# Patient Record
Sex: Male | Born: 1950 | Race: White | Hispanic: No | Marital: Married | Smoking: Former smoker
Health system: Southern US, Community
[De-identification: ages and names within clinical notes are randomized; demographics above are authoritative.]

## PROBLEM LIST (undated history)

## (undated) DIAGNOSIS — F32A Depression, unspecified: Secondary | ICD-10-CM

## (undated) DIAGNOSIS — F329 Major depressive disorder, single episode, unspecified: Secondary | ICD-10-CM

## (undated) DIAGNOSIS — F419 Anxiety disorder, unspecified: Secondary | ICD-10-CM

## (undated) DIAGNOSIS — J45909 Unspecified asthma, uncomplicated: Secondary | ICD-10-CM

## (undated) DIAGNOSIS — T7840XA Allergy, unspecified, initial encounter: Secondary | ICD-10-CM

## (undated) HISTORY — DX: Anxiety disorder, unspecified: F41.9

## (undated) HISTORY — PX: APPENDECTOMY: SHX54

## (undated) HISTORY — DX: Depression, unspecified: F32.A

## (undated) HISTORY — DX: Major depressive disorder, single episode, unspecified: F32.9

## (undated) HISTORY — DX: Allergy, unspecified, initial encounter: T78.40XA

---

## 2013-11-29 ENCOUNTER — Emergency Department (HOSPITAL_COMMUNITY)
Admission: EM | Admit: 2013-11-29 | Discharge: 2013-11-29 | Disposition: A | Discharge status: Home / Self Care | Payer: Self-pay | Attending: Emergency Medicine | Admitting: Emergency Medicine

## 2013-11-29 ENCOUNTER — Emergency Department (HOSPITAL_COMMUNITY): Payer: Self-pay

## 2013-11-29 ENCOUNTER — Encounter (HOSPITAL_COMMUNITY): Payer: Self-pay | Admitting: Emergency Medicine

## 2013-11-29 DIAGNOSIS — Z79899 Other long term (current) drug therapy: Secondary | ICD-10-CM | POA: Insufficient documentation

## 2013-11-29 DIAGNOSIS — J441 Chronic obstructive pulmonary disease with (acute) exacerbation: Secondary | ICD-10-CM | POA: Insufficient documentation

## 2013-11-29 DIAGNOSIS — Z87891 Personal history of nicotine dependence: Secondary | ICD-10-CM | POA: Insufficient documentation

## 2013-11-29 HISTORY — DX: Unspecified asthma, uncomplicated: J45.909

## 2013-11-29 LAB — BASIC METABOLIC PANEL
BUN: 9 mg/dL (ref 6–23)
CO2: 24 mEq/L (ref 19–32)
Chloride: 100 mEq/L (ref 96–112)
GFR calc Af Amer: >90 mL/min (ref >90)
Glucose, Bld: 131 mg/dL — ABNORMAL HIGH (ref 70–99)
Potassium: 3.8 mEq/L (ref 3.5–5.1)
Sodium: 136 mEq/L (ref 135–145)

## 2013-11-29 LAB — CBC WITH DIFFERENTIAL/PLATELET
Basophils Relative: 1 % (ref 0–1)
HCT: 44.1 % (ref 39.0–52.0)
Hemoglobin: 15.5 g/dL (ref 13.0–17.0)
Lymphocytes Relative: 21 % (ref 12–46)
Lymphs Abs: 2.2 10*3/uL (ref 0.7–4.0)
Monocytes Relative: 4 % (ref 3–12)
Neutro Abs: 7.6 10*3/uL (ref 1.7–7.7)
Neutrophils Relative %: 71 % (ref 43–77)
RBC: 4.9 MIL/uL (ref 4.22–5.81)
WBC: 10.7 10*3/uL — ABNORMAL HIGH (ref 4.0–10.5)

## 2013-11-29 LAB — PRO B NATRIURETIC PEPTIDE: Pro B Natriuretic peptide (BNP): 56.3 pg/mL (ref 0–125)

## 2013-11-29 MED ORDER — ALBUTEROL SULFATE HFA 108 (90 BASE) MCG/ACT IN AERS
1.0000 | INHALATION_SPRAY | RESPIRATORY_TRACT | Status: DC | PRN
  Filled 2013-11-29: qty 6.7

## 2013-11-29 MED ORDER — ALBUTEROL SULFATE HFA 108 (90 BASE) MCG/ACT IN AERS: 1.0000 | INHALATION_SPRAY | RESPIRATORY_TRACT | Status: DC | PRN

## 2013-11-29 MED ORDER — ALBUTEROL SULFATE (5 MG/ML) 0.5% IN NEBU
5.0000 mg | INHALATION_SOLUTION | RESPIRATORY_TRACT | Status: DC | PRN
Start: 2013-11-29 — End: 2013-11-29
  Administered 2013-11-29: 5 mg via RESPIRATORY_TRACT
  Filled 2013-11-29: qty 1

## 2013-11-29 MED ORDER — PREDNISONE (PAK) 10 MG PO TABS: ORAL_TABLET | ORAL | Status: DC

## 2013-11-29 NOTE — ED Notes (Signed)
Bed: ZO10 Expected date: 11/29/13 Expected time: 11:45 AM Means of arrival: Ambulance Comments: Wheezing/tx in process

## 2013-11-29 NOTE — ED Notes (Signed)
Pt has childhood history of asthma and rarely has attacks. However, pt states that he has been having SOB with wheezing that has gotten progressively worse over the last couple days but got worse last night. Pt is alert and oriented and has received 2 neb treatments, solumedrol,and mag IV by EMS.

## 2013-11-29 NOTE — ED Provider Notes (Signed)
CSN: [id number]     Arrival date & time 11/29/13  1159 History   First MD Initiated Contact with Patient 11/29/13 1214     Chief Complaint  Patient presents with  . Wheezing    HPI Comments: Pt has had intermittent wheezing in his life with the last episode about ten years ago.  In the last two months he started having trouble with short of breath.  He has not seen anyone but last night it became worse.  EMS treated him with albuterol , steroids and magnesium.    He is feeling better now after that.  Patient is a 62 y.o. male presenting with wheezing. The history is provided by the patient.  Wheezing Severity:  Severe Severity compared to prior episodes:  More severe Onset quality:  Gradual Duration: months. Timing:  Constant Progression:  Worsening Context: not animal exposure   Worsened by:  Nothing tried Ineffective treatments: he has tried bronchaid. Associated symptoms: cough and shortness of breath   Associated symptoms: no chest pain, no fever and no sore throat   He feels that it is worse mostly at night time.  Preceding all of this he remembers choking on some honey that he was eating.  Past Medical History  Diagnosis Date  . Asthma    No past surgical history on file. No family history on file. History  Substance Use Topics  . Smoking status: Former Smoker -- 0.50 packs/day    Types: Cigarettes    Quit date: 11/16/2013  . Smokeless tobacco: Never Used  . Alcohol Use: No    Review of Systems  Constitutional: Negative for fever.  HENT: Negative for sore throat.   Respiratory: Positive for cough, shortness of breath and wheezing.   Cardiovascular: Negative for chest pain.  All other systems reviewed and are negative.    Allergies  Review of patient's allergies indicates no known allergies.  Home Medications   Current Outpatient Rx  Name  Route  Sig  Dispense  Refill  . calcium carbonate (OS-CAL) 600 MG TABS tablet   Oral   Take 600 mg by mouth 2 (two)  times daily with a meal.         . cholecalciferol (VITAMIN D) 1000 UNITS tablet   Oral   Take 1,000 Units by mouth daily.         Marland Kitchen Ephedrine-Guaifenesin (BRONKAID) 25-400 MG TABS   Oral   Take 1 tablet by mouth every 4 (four) hours as needed (for chest congestion).         . ferrous sulfate 325 (65 FE) MG tablet   Oral   Take 325 mg by mouth daily with breakfast.         . Multiple Vitamin (MULTIVITAMIN WITH MINERALS) TABS tablet   Oral   Take 1 tablet by mouth daily.         Marland Kitchen omega-3 acid ethyl esters (LOVAZA) 1 G capsule   Oral   Take 1 g by mouth daily.         . vitamin B-12 (CYANOCOBALAMIN) 1000 MCG tablet   Oral   Take 1,000 mcg by mouth daily.         . vitamin C (ASCORBIC ACID) 500 MG tablet   Oral   Take 500 mg by mouth daily.         Marland Kitchen zinc sulfate 220 MG capsule   Oral   Take 220 mg by mouth daily.         Marland Kitchen  albuterol (PROVENTIL HFA;VENTOLIN HFA) 108 (90 BASE) MCG/ACT inhaler   Inhalation   Inhale 1-2 puffs into the lungs every 4 (four) hours as needed for wheezing or shortness of breath.   1 Inhaler   0   . predniSONE (STERAPRED UNI-PAK) 10 MG tablet      Take 6 tabs by mouth daily  for 2 days, then 5 tabs for 2 days, then 4 tabs for 2 days, then 3 tabs for 2 days, 2 tabs for 2 days, then 1 tab by mouth daily for 2 days   42 tablet   0    BP 151/82  Pulse 96  Temp(Src) 98.1 F (36.7 C) (Oral)  Resp 22  Ht 5\' 9"  (1.753 m)  Wt 145 lb (65.772 kg)  BMI 21.40 kg/m2  SpO2 94% Physical Exam  Nursing note and vitals reviewed. Constitutional: He appears well-developed and well-nourished. No distress.  HENT:  Head: Normocephalic and atraumatic.  Right Ear: External ear normal.  Left Ear: External ear normal.  Eyes: Conjunctivae are normal. Right eye exhibits no discharge. Left eye exhibits no discharge. No scleral icterus.  Neck: Neck supple. No tracheal deviation present.  Cardiovascular: Normal rate, regular rhythm and intact  distal pulses.   Pulmonary/Chest: Effort normal. No accessory muscle usage or stridor. No respiratory distress. He has no decreased breath sounds. He has wheezes. He has no rales.  Abdominal: Soft. Bowel sounds are normal. He exhibits no distension. There is no tenderness. There is no rebound and no guarding.  Musculoskeletal: He exhibits no edema and no tenderness.  Neurological: He is alert. He has normal strength. No sensory deficit. Cranial nerve deficit:  no gross defecits noted. He exhibits normal muscle tone. He displays no seizure activity. Coordination normal.  Skin: Skin is warm and dry. No rash noted.  Psychiatric: He has a normal mood and affect.    ED Course  Procedures (including critical care time) Labs Review Labs Reviewed  CBC WITH DIFFERENTIAL - Abnormal; Notable for the following:    WBC 10.7 (*)    All other components within normal limits  BASIC METABOLIC PANEL - Abnormal; Notable for the following:    Glucose, Bld 131 (*)    All other components within normal limits  PRO B NATRIURETIC PEPTIDE   Imaging Review Dg Chest 2 View  11/29/2013   CLINICAL DATA:  Cough and wheezing and dyspnea, history of tobacco use as well styled and asthma.  EXAM: CHEST  2 VIEW  COMPARISON:  None.  FINDINGS: The lungs are hyperinflated with hemidiaphragm flattening. There is no focal infiltrate. There is no pleural effusion or pneumothorax. The cardiopericardial silhouette is normal in size. The mediastinum is normal in width. The pulmonary vascularity is not engorged. The observed portions of the bony thorax exhibit no acute abnormality.  IMPRESSION: There is hyperinflation consistent with COPD. There is no evidence of pneumonia nor CHF.   Electronically Signed   By: David  [place]   On: 11/29/2013 13:26    EKG Interpretation   None       MDM   1. COPD exacerbation    Pt improved with treatment. Counseled on smoking cessation.   Will dc home with inhaler and steroids.  Follow up  with a PCP.  Referrals given    Celene Kras, MD 11/29/13 [phone]

## 2014-02-14 ENCOUNTER — Ambulatory Visit: Payer: Managed Care, Other (non HMO) | Admitting: Family Medicine

## 2014-02-14 VITALS — BP 138/82 | HR 83 | Temp 97.8°F | Resp 16 | Ht 68.35 in | Wt 153.6 lb

## 2014-02-14 DIAGNOSIS — J4489 Other specified chronic obstructive pulmonary disease: Secondary | ICD-10-CM

## 2014-02-14 DIAGNOSIS — J449 Chronic obstructive pulmonary disease, unspecified: Secondary | ICD-10-CM

## 2014-02-14 DIAGNOSIS — J4540 Moderate persistent asthma, uncomplicated: Secondary | ICD-10-CM

## 2014-02-14 DIAGNOSIS — J45909 Unspecified asthma, uncomplicated: Secondary | ICD-10-CM

## 2014-02-14 MED ORDER — ALBUTEROL SULFATE HFA 108 (90 BASE) MCG/ACT IN AERS: 1.0000 | INHALATION_SPRAY | RESPIRATORY_TRACT | Status: DC | PRN

## 2014-02-14 MED ORDER — BECLOMETHASONE DIPROPIONATE 80 MCG/ACT IN AERS: 1.0000 | INHALATION_SPRAY | Freq: Two times a day (BID) | RESPIRATORY_TRACT | Status: DC

## 2014-02-14 NOTE — Patient Instructions (Signed)
Start inhaled steroid twice per day every day. albuterol if needed.  Recheck in 1 month - sooner if any worsening. We will call you to schedule appointment.  Return to the clinic or go to the nearest emergency room if any of your symptoms worsen or new symptoms occur.

## 2014-02-14 NOTE — Progress Notes (Signed)
Subjective:  This chart was scribed for Shade Flood, MD by Elveria Rising, Medial Scribe.  This patient was seen in room 12 and the patient's care was started at 5:41 PM.    Patient ID: Dylan Rodgers, male    DOB: 08/03/1951, 63 y.o.   MRN: [id number]  HPI Dylan Rodgers is a 63 y.o. male PCP: No PCP Per Patient  Patient here requesting albuterol refill. Patient last seen in Emergency 11/29/13, BIBA. Treated with albuterol, steroids and magnesium by EMS. CXR showed hyper inflammation consistent with COPD. Patient discharged on albuterol inhaler and steroids. Patient says that his wheezing is an issue he's dealt with since childhood. Between ages [age] his respiratory issues tapered. Since, he's had 1-2 episodes, with mild wheezing. But 11/29/13 patient says the episode intensified and escalated very quickly. Patient has been using his prescribed albuterol x4 daily for chest tightness and wheezing, but patient describes his symptoms as "light." Patient has exhausted 200 dose albuterol prescription given in December 2014. Patient says that a few times a week his coughing/wheezing awakens him from sleep.  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 Patient says he's never had any issues with his breathing while cycling. Patient has ridden twice since his attack in December, biking about 20 miles, and patient reports no issues.   Patient says he quit smoking in December 2014 and has not replased. 20-25 pack year history.   There are no active problems to display for this patient.  Past Medical History  Diagnosis Date  . Asthma    No past surgical history on file. No Known Allergies Prior to Admission medications   Medication Sig Start Date End Date Taking? Authorizing Provider  albuterol (PROVENTIL HFA;VENTOLIN HFA) 108 (90 BASE) MCG/ACT inhaler Inhale 1-2 puffs into the lungs every 4 (four) hours as needed for wheezing or  shortness of breath. 11/29/13  Yes Celene Kras, MD  calcium carbonate (OS-CAL) 600 MG TABS tablet Take 600 mg by mouth 2 (two) times daily with a meal.   Yes Historical Provider, MD  cholecalciferol (VITAMIN D) 1000 UNITS tablet Take 1,000 Units by mouth daily.   Yes Historical Provider, MD  Multiple Vitamin (MULTIVITAMIN WITH MINERALS) TABS tablet Take 1 tablet by mouth daily.   Yes Historical Provider, MD  vitamin C (ASCORBIC ACID) 500 MG tablet Take 500 mg by mouth daily.   Yes Historical Provider, MD  zinc sulfate 220 MG capsule Take 220 mg by mouth daily.   Yes Historical Provider, MD  Ephedrine-Guaifenesin (BRONKAID) 25-400 MG TABS Take 1 tablet by mouth every 4 (four) hours as needed (for chest congestion).    Historical Provider, MD  vitamin B-12 (CYANOCOBALAMIN) 1000 MCG tablet Take 1,000 mcg by mouth daily.    Historical Provider, MD   History   Social History  . Marital Status: Married    Spouse Name: N/A    Number of Children: N/A  . Years of Education: N/A   Occupational History  . Not on file.   Social History Main Topics  . Smoking status: Former Smoker -- 0.50 packs/day    Types: Cigarettes    Quit date: 11/16/2013  . Smokeless tobacco: Never Used  . Alcohol Use: No  . Drug Use: Yes    Special: Marijuana  . Sexual Activity: Not on file   Other Topics Concern  . Not on file   Social History Narrative  .  No narrative on file       Review of Systems  Respiratory: Positive for cough and wheezing.        Objective:   Physical Exam  Nursing note and vitals reviewed. Constitutional: He is oriented to person, place, and time. He appears well-developed and well-nourished. No distress.  HENT:  Head: Normocephalic and atraumatic.  Right Ear: Tympanic membrane, external ear and ear canal normal.  Left Ear: Tympanic membrane, external ear and ear canal normal.  Nose: No rhinorrhea.  Mouth/Throat: Oropharynx is clear and moist and mucous membranes are normal.  No oropharyngeal exudate or posterior oropharyngeal erythema.  Some cerumen in the canal. TMs unable to completely visualize due to cerumen.    Eyes: Conjunctivae and EOM are normal. Pupils are equal, round, and reactive to light.  Neck: Neck supple. No tracheal deviation present.  Cardiovascular: Normal rate, regular rhythm, normal heart sounds and intact distal pulses.   No murmur heard. Pulmonary/Chest: Effort normal and breath sounds normal. No respiratory distress. He has no rhonchi. He has no rales.  Faint wheeze heard with forced expiration only.   Abdominal: Soft. There is no tenderness.  Musculoskeletal: Normal range of motion.  Lymphadenopathy:    He has no cervical adenopathy.  Neurological: He is alert and oriented to person, place, and time.  Skin: Skin is warm and dry. No rash noted.  Psychiatric: He has a normal mood and affect. His behavior is normal.   Results for orders placed during the hospital encounter of 11/29/13  CBC WITH DIFFERENTIAL      Result Value Ref Range   WBC 10.7 (*) 4.0 - 10.5 K/uL   RBC 4.90  4.22 - 5.81 MIL/uL   Hemoglobin 15.5  13.0 - 17.0 g/dL   HCT 96.0  45.4 - 09.8 %   MCV 90.0  78.0 - 100.0 fL   MCH 31.6  26.0 - 34.0 pg   MCHC 35.1  30.0 - 36.0 g/dL   RDW 11.9  14.7 - 82.9 %   Platelets 197  150 - 400 K/uL   Neutrophils Relative % 71  43 - 77 %   Neutro Abs 7.6  1.7 - 7.7 K/uL   Lymphocytes Relative 21  12 - 46 %   Lymphs Abs 2.2  0.7 - 4.0 K/uL   Monocytes Relative 4  3 - 12 %   Monocytes Absolute 0.4  0.1 - 1.0 K/uL   Eosinophils Relative 4  0 - 5 %   Eosinophils Absolute 0.4  0.0 - 0.7 K/uL   Basophils Relative 1  0 - 1 %   Basophils Absolute 0.1  0.0 - 0.1 K/uL  BASIC METABOLIC PANEL      Result Value Ref Range   Sodium 136  135 - 145 mEq/L   Potassium 3.8  3.5 - 5.1 mEq/L   Chloride 100  96 - 112 mEq/L   CO2 24  19 - 32 mEq/L   Glucose, Bld 131 (*) 70 - 99 mg/dL   BUN 9  6 - 23 mg/dL   Creatinine, Ser 5.62  0.50 - 1.35  mg/dL   Calcium 9.2  8.4 - 13.0 mg/dL   GFR calc non Af Amer >90  >90 mL/min   GFR calc Af Amer >90  >90 mL/min  PRO B NATRIURETIC PEPTIDE      Result Value Ref Range   Pro B Natriuretic peptide (BNP) 56.3  0 - 125 pg/mL     Filed Vitals:  02/14/14 1623  BP: 138/82  Pulse: 83  Temp: 97.8 F (36.6 C)  TempSrc: Oral  Resp: 16  Height: 5' 8.35" (1.736 m)  Weight: 153 lb 9.6 oz (69.673 kg)  SpO2: 97%   Peak flow 275. Predicted peak flow between 502 - 542.  Fair to good effort.    Assessment & Plan:   Dylan Rodgers is a 63 y.o. male Asthma, moderate persistent - Plan: beclomethasone (QVAR) 80 MCG/ACT inhaler, albuterol (PROVENTIL HFA;VENTOLIN HFA) 108 (90 BASE) MCG/ACT inhaler  COPD with asthma - Plan: beclomethasone (QVAR) 80 MCG/ACT inhaler, albuterol (PROVENTIL HFA;VENTOLIN HFA) 108 (90 BASE) MCG/ACT inhaler  Obstructive lung dz likley combo of Asthma and COPD with significant smoking history.   D/t frequency of short acting bronchodilator - will start ICS - Qvar, and cont albuterol prn.  Plan on spirometry likely at follow up. rtc precautions.   Meds ordered this encounter  Medications  . beclomethasone (QVAR) 80 MCG/ACT inhaler    Sig: Inhale 1 puff into the lungs 2 (two) times daily.    Dispense:  1 Inhaler    Refill:  2  . albuterol (PROVENTIL HFA;VENTOLIN HFA) 108 (90 BASE) MCG/ACT inhaler    Sig: Inhale 1-2 puffs into the lungs every 4 (four) hours as needed for wheezing or shortness of breath.    Dispense:  1 Inhaler    Refill:  0   Patient Instructions  Start inhaled steroid twice per day every day. albuterol if needed.  Recheck in 1 month - sooner if any worsening. We will call you to schedule appointment.  Return to the clinic or go to the nearest emergency room if any of your symptoms worsen or new symptoms occur.   I personally performed the services described in this documentation, which was scribed in my presence. The recorded information has been  reviewed and considered, and addended by me as needed.

## 2014-02-16 ENCOUNTER — Telehealth: Payer: Self-pay | Admitting: General Practice

## 2014-02-16 NOTE — Telephone Encounter (Signed)
Called and left vm for patient to give us a call back to schedule 1 month with Neva SeatGreene for 30 min appt for new pt and follow up asthma/copd. Plan on spirometry that day

## 2015-10-26 ENCOUNTER — Ambulatory Visit (INDEPENDENT_AMBULATORY_CARE_PROVIDER_SITE_OTHER): Payer: Managed Care, Other (non HMO) | Admitting: Family Medicine

## 2015-10-26 VITALS — BP 132/78 | HR 64 | Temp 98.2°F | Resp 18 | Ht 68.35 in | Wt 161.0 lb

## 2015-10-26 DIAGNOSIS — F4010 Social phobia, unspecified: Secondary | ICD-10-CM

## 2015-10-26 DIAGNOSIS — J45909 Unspecified asthma, uncomplicated: Secondary | ICD-10-CM | POA: Diagnosis not present

## 2015-10-26 DIAGNOSIS — J4540 Moderate persistent asthma, uncomplicated: Secondary | ICD-10-CM | POA: Diagnosis not present

## 2015-10-26 DIAGNOSIS — J449 Chronic obstructive pulmonary disease, unspecified: Secondary | ICD-10-CM | POA: Diagnosis not present

## 2015-10-26 MED ORDER — FLUOXETINE HCL 20 MG PO TABS: 20.0000 mg | ORAL_TABLET | Freq: Every day | ORAL | Status: DC

## 2015-10-26 MED ORDER — BECLOMETHASONE DIPROPIONATE 80 MCG/ACT IN AERS: 1.0000 | INHALATION_SPRAY | Freq: Two times a day (BID) | RESPIRATORY_TRACT | Status: DC

## 2015-10-26 MED ORDER — ALBUTEROL SULFATE HFA 108 (90 BASE) MCG/ACT IN AERS: 1.0000 | INHALATION_SPRAY | RESPIRATORY_TRACT | Status: DC | PRN

## 2015-10-26 NOTE — Patient Instructions (Signed)
Stop Bronkaid. Start Qvar twice per day and albuterol only as needed. If you do have to use albuterol more than 2 nights per week, or increasing used during the day, return to discuss other treatments. Otherwise follow-up with me in the next month or 2.  For the social anxiety symptoms, start fluoxetine once per day. Can consider cognitive behavioral therapy or meeting with a therapist again as this is shown to be beneficial especially for performance anxiety.  We could also consider one of medications like Xanax in the future if we are not achieve the results with the Loxitane. Follow-up with me in the next month or 2 to discuss this further.  Return to the clinic or go to the nearest emergency room if any of your symptoms worsen or new symptoms occur.  Asthma, Adult Asthma is a recurring condition in which the airways tighten and narrow. Asthma can make it difficult to breathe. It can cause coughing, wheezing, and shortness of breath. Asthma episodes, also called asthma attacks, range from minor to life-threatening. Asthma cannot be cured, but medicines and lifestyle changes can help control it. CAUSES Asthma is believed to be caused by inherited (genetic) and environmental factors, but its exact cause is unknown. Asthma may be triggered by allergens, lung infections, or irritants in the air. Asthma triggers are different for each person. Common triggers include:   Animal dander.  Dust mites.  Cockroaches.  Pollen from trees or grass.  Mold.  Smoke.  Air pollutants such as dust, household cleaners, hair sprays, aerosol sprays, paint fumes, strong chemicals, or strong odors.  Cold air, weather changes, and winds (which increase molds and pollens in the air).  Strong emotional expressions such as crying or laughing hard.  Stress.  Certain medicines (such as aspirin) or types of drugs (such as beta-blockers).  Sulfites in foods and drinks. Foods and drinks that may contain sulfites  include dried fruit, potato chips, and sparkling grape juice.  Infections or inflammatory conditions such as the flu, a cold, or an inflammation of the nasal membranes (rhinitis).  Gastroesophageal reflux disease (GERD).  Exercise or strenuous activity. SYMPTOMS Symptoms may occur immediately after asthma is triggered or many hours later. Symptoms include:  Wheezing.  Excessive nighttime or early morning coughing.  Frequent or severe coughing with a common cold.  Chest tightness.  Shortness of breath. DIAGNOSIS  The diagnosis of asthma is made by a review of your medical history and a physical exam. Tests may also be performed. These may include:  Lung function studies. These tests show how much air you breathe in and out.  Allergy tests.  Imaging tests such as X-rays. TREATMENT  Asthma cannot be cured, but it can usually be controlled. Treatment involves identifying and avoiding your asthma triggers. It also involves medicines. There are 2 classes of medicine used for asthma treatment:   Controller medicines. These prevent asthma symptoms from occurring. They are usually taken every day.  Reliever or rescue medicines. These quickly relieve asthma symptoms. They are used as needed and provide short-term relief. Your health care provider will help you create an asthma action plan. An asthma action plan is a written plan for managing and treating your asthma attacks. It includes a list of your asthma triggers and how they may be avoided. It also includes information on when medicines should be taken and when their dosage should be changed. An action plan may also involve the use of a device called a peak flow meter. A peak  flow meter measures how well the lungs are working. It helps you monitor your condition. HOME CARE INSTRUCTIONS   Take medicines only as directed by your health care provider. Speak with your health care provider if you have questions about how or when to take the  medicines.  Use a peak flow meter as directed by your health care provider. Record and keep track of readings.  Understand and use the action plan to help minimize or stop an asthma attack without needing to seek medical care.  Control your home environment in the following ways to help prevent asthma attacks:  Do not smoke. Avoid being exposed to secondhand smoke.  Change your heating and air conditioning filter regularly.  Limit your use of fireplaces and wood stoves.  Get rid of pests (such as roaches and mice) and their droppings.  Throw away plants if you see mold on them.  Clean your floors and dust regularly. Use unscented cleaning products.  Try to have someone else vacuum for you regularly. Stay out of rooms while they are being vacuumed and for a short while afterward. If you vacuum, use a dust mask from a hardware store, a double-layered or microfilter vacuum cleaner bag, or a vacuum cleaner with a HEPA filter.  Replace carpet with wood, tile, or vinyl flooring. Carpet can trap dander and dust.  Use allergy-proof pillows, mattress covers, and box spring covers.  Wash bed sheets and blankets every week in hot water and dry them in a dryer.  Use blankets that are made of polyester or cotton.  Clean bathrooms and kitchens with bleach. If possible, have someone repaint the walls in these rooms with mold-resistant paint. Keep out of the rooms that are being cleaned and painted.  Wash hands frequently. SEEK MEDICAL CARE IF:   You have wheezing, shortness of breath, or a cough even if taking medicine to prevent attacks.  The colored mucus you cough up (sputum) is thicker than usual.  Your sputum changes from clear or white to yellow, green, gray, or bloody.  You have any problems that may be related to the medicines you are taking (such as a rash, itching, swelling, or trouble breathing).  You are using a reliever medicine more than 2-3 times per week.  Your peak flow  is still at 50-79% of your personal best after following your action plan for 1 hour.  You have a fever. SEEK IMMEDIATE MEDICAL CARE IF:   You seem to be getting worse and are unresponsive to treatment during an asthma attack.  You are short of breath even at rest.  You get short of breath when doing very little physical activity.  You have difficulty eating, drinking, or talking due to asthma symptoms.  You develop chest pain.  You develop a fast heartbeat.  You have a bluish color to your lips or fingernails.  You are light-headed, dizzy, or faint.  Your peak flow is less than 50% of your personal best.   This information is not intended to replace advice given to you by your health care provider. Make sure you discuss any questions you have with your health care provider.   Document Released: 11/27/2005 Document Revised: 08/18/2015 Document Reviewed: 06/26/2013 Elsevier Interactive Patient Education 2016 [employer].   Social Anxiety Disorder Social anxiety disorder, previously called social phobia, is a mental disorder. People with social anxiety disorder frequently feel nervous, afraid, or embarrassed when around other people in social situations. They constantly worry that other people are  judging or criticizing them for how they look, what they say, or how they act. They may worry that other people might reject them because of their appearance or behavior. Social anxiety disorder is more than just occasional shyness or self-consciousness. It can cause severe emotional distress. It can interfere with daily life activities. Social anxiety disorder also may lead to excessive alcohol or drug use and even suicide.  Social anxiety disorder is actually one of the most common mental disorders. It can develop at any time but usually starts in the teenage years. Women are more commonly affected than men. Social anxiety disorder is also more common in people who have family members with  anxiety disorders. It also is more common in people who have physical deformities or conditions with characteristics that are obvious to others, such as stuttered speech or movement abnormalities (Parkinson disease).  SYMPTOMS  In addition to feeling anxious or fearful in social situations, people with social anxiety disorder frequently have physical symptoms. Examples include:  Red face (blushing).  Racing heart.  Sweating.  Shaky hands or voice.  Confusion.  Light-headedness.  Upset stomach and diarrhea. DIAGNOSIS  Social anxiety disorder is diagnosed through an assessment by your health care provider. Your health care provider will ask you questions about your mood, thoughts, and reactions in social situations. Your health care provider may ask you about your medical history and use of alcohol or drugs, including prescription medicines. Certain medical conditions and the use of certain substances, including caffeine, can cause symptoms similar to social anxiety disorder. Your health care provider may refer you to a mental health specialist for further evaluation or treatment. The criteria for diagnosis of social anxiety disorder are:  Marked fear or anxiety in one or more social situations in which you may be closely watched or studied by others. Examples of such situations include:  Interacting socially (having a conversation with others, going to a party, or meeting strangers).  Being observed (eating or drinking in public or being called on in class).  Performing in front of others (giving a speech).  The social situations of concern almost always cause fear or anxiety, not just occasionally.  People with social anxiety disorder fear that they will be viewed negatively in a way that will be embarrassing, will lead to rejection, or will offend others. This fear is out of proportion to the actual threat posed by the social situation.  Often the triggering social situations are  avoided, or they are endured with intense fear or anxiety. The fear, anxiety, or avoidance is persistent and lasts for 6 months or longer.  The anxiety causes difficulty functioning in at least some parts of your daily life. TREATMENT  Several types of treatment are available for social anxiety disorder. These treatments are often used in combination and include:   Talk therapy. Group talk therapy allows you to see that you are not alone with these problems. Individual talk therapy helps you address your specific anxiety issues with a caring professional. The most effective forms of talk therapy for social anxiety disorder are cognitive-behavioral therapy and exposure therapy. Cognitive-behavioral therapy helps you to identify and change negative thoughts and beliefs that are at the root of the disorder. Exposure therapy allows you to gradually face the situations that you fear most.  Relaxation and coping techniques. These include deep breathing, self-talk, meditation, visual imagery, and yoga. Relaxation techniques help to keep you calm in social situations.  Social Optician, dispensing.Social skills can be learned on  your own or with the help of a talk therapist. They can help you feel more confident and comfortable in social situations.  Medicine. For anxiety limited to performance situations (performance anxiety), medicine called beta blockers can help by reducing or preventing the physical symptoms of social anxiety disorder. For more persistent and generalized social anxiety, antidepressant medicine may be prescribed to help control symptoms. In severe cases of social anxiety disorder, strong antianxiety medicine, called benzodiazepines, may be prescribed on a limited basis and for a short time.   This information is not intended to replace advice given to you by your health care provider. Make sure you discuss any questions you have with your health care provider.   Document Released: 10/26/2005  Document Revised: 12/18/2014 Document Reviewed: 02/25/2013 Elsevier Interactive Patient Education [employer]

## 2015-10-26 NOTE — Progress Notes (Addendum)
Subjective:    Patient ID: Dylan Rodgers, male    DOB: May 21, 1951, 64 y.o.   MRN: [id number] This chart was scribed for [name], MD by Jolene Provost, Medical Scribe. This patient was seen in Room 13 and the patient's care was started a 6:53 PM.  Chief Complaint  Patient presents with  . Medication Refill    Albuterol and QVAR  . Depression    Per screening/ not able to get outside due to asthma   HPI HPI Comments: Dylan Rodgers is a 64 y.o. male who presents to [facility] complaining of SOB. He was last seen in [place] of 2015 with severe SOB. He was prescribed qvar and albuterol at that time. He states he stopped qvar after two months, because he ran out, but then found that he had two refills and restarted the medication. He finished the qvar last October, and has not had to use anything since that time until one week ago. Over the last week he started having SOB and chest tightness which is worse at night. He has been using OTC bronchaid for the last week. 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 He denies CP, swelling in legs, or waking up in the middle of the night with SOB.   Pt was last seen in March 2015, with COPD and asthma. He quit smoking in December 2014 after many years of smoking. At last visit he was having nighttime SOB. He was started on Qvar at that visit planned on following up in one month with possible spirometry at that visit, but has not followed up until today.   Additionally at the end of the visit, he reported some possible symptoms of social anxiety disorder. 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 He had worked number of different jobs through [employer], but again his social anxiety prohibited him from keeping these jobs. 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 He was seen 4 years ago by [facility] and prescribe Xanax, but he did not feel this is helpful and possible side effects with it. His current treatment for his social anxiety symptoms is cycling, which he states he does very frequently and holds local record for mileage. Denies any depression other than if his asthma keeps him from going outside, denies suicidal ideas. Has not tried SSRI in the past but would be open to this today..  Depression screen PHQ 2/9 10/26/2015  Decreased Interest 1  Down, Depressed, Hopeless 3  PHQ - 2 Score 4  Altered sleeping 3  Tired, decreased energy 2  Change in appetite 1  Feeling bad or failure about yourself  1  Trouble concentrating 0  Moving slowly or fidgety/restless 0  Suicidal thoughts 0  PHQ-9 Score 11  Difficult doing work/chores Somewhat difficult      There are no active problems to display for this patient.  Past Medical History  Diagnosis Date  . Asthma    No past surgical history on file. No Known Allergies Prior to Admission medications   Medication Sig  Start Date End Date Taking? Authorizing Provider  calcium carbonate (OS-CAL) 600 MG TABS tablet Take 600 mg by mouth 2 (two) times daily with a meal.   Yes Historical Provider, MD  cholecalciferol (VITAMIN D) 1000 UNITS tablet Take 1,000 Units by mouth daily.   Yes Historical Provider, MD  Ephedrine-Guaifenesin (BRONKAID) 25-400 MG TABS Take 1 tablet by mouth every 4 (four) hours as needed (for chest congestion).   Yes Historical Provider, MD  Multiple Vitamin (MULTIVITAMIN WITH MINERALS) TABS tablet Take 1 tablet by mouth daily.   Yes Historical Provider, MD  vitamin B-12 (CYANOCOBALAMIN) 1000 MCG tablet Take 1,000 mcg by mouth daily.   Yes  Historical Provider, MD  vitamin C (ASCORBIC ACID) 500 MG tablet Take 500 mg by mouth daily.   Yes Historical Provider, MD  zinc sulfate 220 MG capsule Take 220 mg by mouth daily.   Yes Historical Provider, MD  albuterol (PROVENTIL HFA;VENTOLIN HFA) 108 (90 BASE) MCG/ACT inhaler Inhale 1-2 puffs into the lungs every 4 (four) hours as needed for wheezing or shortness of breath. Patient not taking: Reported on 10/26/2015 02/14/14   Shade Flood, MD  beclomethasone (QVAR) 80 MCG/ACT inhaler Inhale 1 puff into the lungs 2 (two) times daily. Patient not taking: Reported on 10/26/2015 02/14/14   Shade Flood, MD   Social History   Social History  . Marital Status: Married    Spouse Name: N/A  . Number of Children: N/A  . Years of Education: N/A   Occupational History  . Not on file.   Social History Main Topics  . Smoking status: Former Smoker -- 0.50 packs/day    Types: Cigarettes    Quit date: 11/16/2013  . Smokeless tobacco: Never Used  . Alcohol Use: No  . Drug Use: Yes    Special: Marijuana  . Sexual Activity: Not on file   Other Topics Concern  . Not on file   Social History Narrative    Review of Systems  Constitutional: Negative for fever and chills.  Respiratory: Positive for chest tightness, shortness of breath and wheezing.   Cardiovascular: Negative for chest pain and leg swelling.       Objective:  BP 132/78 mmHg  Pulse 64  Temp(Src) 98.2 F (36.8 C) (Oral)  Resp 18  Ht 5' 8.35" (1.736 m)  Wt 161 lb (73.029 kg)  BMI 24.23 kg/m2  SpO2 97%  Physical Exam  Constitutional: He is oriented to person, place, and time. He appears well-developed and well-nourished. No distress.  HENT:  Head: Normocephalic and atraumatic.  Right Ear: Tympanic membrane, external ear and ear canal normal.  Left Ear: Tympanic membrane, external ear and ear canal normal.  Nose: Nose normal. No rhinorrhea.  Mouth/Throat: Oropharynx is clear and moist and mucous membranes are  normal. No oropharyngeal exudate or posterior oropharyngeal erythema.  Eyes: Conjunctivae are normal. Pupils are equal, round, and reactive to light.  Neck: Neck supple.  Cardiovascular: Normal rate, regular rhythm, normal heart sounds and intact distal pulses.   No murmur heard. Pulmonary/Chest: Effort normal and breath sounds normal. No respiratory distress. He has no wheezes. He has no rhonchi. He has no rales.  Abdominal: Soft. There is no tenderness.  Musculoskeletal: Normal range of motion.  Lymphadenopathy:    He has no cervical adenopathy.  Neurological: He is alert and oriented to person, place, and time. Coordination normal.  Skin: Skin is warm and dry. No rash noted. He is not diaphoretic.  Psychiatric: He has a  normal mood and affect. His behavior is normal.  Nursing note and vitals reviewed.     Assessment & Plan:   ZADOK HOLAWAY is a 64 y.o. male Asthma, moderate persistent, uncomplicated - Plan: beclomethasone (QVAR) 80 MCG/ACT inhaler, albuterol (PROVENTIL HFA;VENTOLIN HFA) 108 (90 BASE) MCG/ACT inhaler COPD with asthma (HCC)  Suspected primary asthma plus/minus COPD with smoking history. Had improved with Qvar in the past, now with recurrence of symptoms off medications. Restart Qvar 80 MCG twice a day, albuterol as needed, ER/RTC precautions if increased use of albuterol or worsening symptoms, and follow-up in next 2 months.  Social anxiety disorder - Plan: FLUoxetine (PROZAC) 20 MG tablet  -Long-standing issue by his discussion today. This may have impacted his career throughout the years as insufficiently treated. Agreed on trying Prozac initially, consider counseling again, consider short-term Xanax or other benzodiazepine if needed for specific performance anxiety. Recheck in the next 1-2 months.  Meds ordered this encounter  Medications  . beclomethasone (QVAR) 80 MCG/ACT inhaler    Sig: Inhale 1 puff into the lungs 2 (two) times daily.    Dispense:  1 Inhaler      Refill:  2  . albuterol (PROVENTIL HFA;VENTOLIN HFA) 108 (90 BASE) MCG/ACT inhaler    Sig: Inhale 1-2 puffs into the lungs every 4 (four) hours as needed for wheezing or shortness of breath.    Dispense:  1 Inhaler    Refill:  0  . FLUoxetine (PROZAC) 20 MG tablet    Sig: Take 1 tablet (20 mg total) by mouth daily.    Dispense:  30 tablet    Refill:  2   Patient Instructions  Stop Bronkaid. Start Qvar twice per day and albuterol only as needed. If you do have to use albuterol more than 2 nights per week, or increasing used during the day, return to discuss other treatments. Otherwise follow-up with me in the next month or 2.  For the social anxiety symptoms, start fluoxetine once per day. Can consider cognitive behavioral therapy or meeting with a therapist again as this is shown to be beneficial especially for performance anxiety.  We could also consider one of medications like Xanax in the future if we are not achieve the results with the Loxitane. Follow-up with me in the next month or 2 to discuss this further.  Return to the clinic or go to the nearest emergency room if any of your symptoms worsen or new symptoms occur.  Asthma, Adult Asthma is a recurring condition in which the airways tighten and narrow. Asthma can make it difficult to breathe. It can cause coughing, wheezing, and shortness of breath. Asthma episodes, also called asthma attacks, range from minor to life-threatening. Asthma cannot be cured, but medicines and lifestyle changes can help control it. CAUSES Asthma is believed to be caused by inherited (genetic) and environmental factors, but its exact cause is unknown. Asthma may be triggered by allergens, lung infections, or irritants in the air. Asthma triggers are different for each person. Common triggers include:   Animal dander.  Dust mites.  Cockroaches.  Pollen from trees or grass.  Mold.  Smoke.  Air pollutants such as dust, household cleaners, hair  sprays, aerosol sprays, paint fumes, strong chemicals, or strong odors.  Cold air, weather changes, and winds (which increase molds and pollens in the air).  Strong emotional expressions such as crying or laughing hard.  Stress.  Certain medicines (such as aspirin) or types of drugs (such as beta-blockers).  Sulfites in foods and drinks. Foods and drinks that may contain sulfites include dried fruit, potato chips, and sparkling grape juice.  Infections or inflammatory conditions such as the flu, a cold, or an inflammation of the nasal membranes (rhinitis).  Gastroesophageal reflux disease (GERD).  Exercise or strenuous activity. SYMPTOMS Symptoms may occur immediately after asthma is triggered or many hours later. Symptoms include:  Wheezing.  Excessive nighttime or early morning coughing.  Frequent or severe coughing with a common cold.  Chest tightness.  Shortness of breath. DIAGNOSIS  The diagnosis of asthma is made by a review of your medical history and a physical exam. Tests may also be performed. These may include:  Lung function studies. These tests show how much air you breathe in and out.  Allergy tests.  Imaging tests such as X-rays. TREATMENT  Asthma cannot be cured, but it can usually be controlled. Treatment involves identifying and avoiding your asthma triggers. It also involves medicines. There are 2 classes of medicine used for asthma treatment:   Controller medicines. These prevent asthma symptoms from occurring. They are usually taken every day.  Reliever or rescue medicines. These quickly relieve asthma symptoms. They are used as needed and provide short-term relief. Your health care provider will help you create an asthma action plan. An asthma action plan is a written plan for managing and treating your asthma attacks. It includes a list of your asthma triggers and how they may be avoided. It also includes information on when medicines should be taken  and when their dosage should be changed. An action plan may also involve the use of a device called a peak flow meter. A peak flow meter measures how well the lungs are working. It helps you monitor your condition. HOME CARE INSTRUCTIONS   Take medicines only as directed by your health care provider. Speak with your health care provider if you have questions about how or when to take the medicines.  Use a peak flow meter as directed by your health care provider. Record and keep track of readings.  Understand and use the action plan to help minimize or stop an asthma attack without needing to seek medical care.  Control your home environment in the following ways to help prevent asthma attacks:  Do not smoke. Avoid being exposed to secondhand smoke.  Change your heating and air conditioning filter regularly.  Limit your use of fireplaces and wood stoves.  Get rid of pests (such as roaches and mice) and their droppings.  Throw away plants if you see mold on them.  Clean your floors and dust regularly. Use unscented cleaning products.  Try to have someone else vacuum for you regularly. Stay out of rooms while they are being vacuumed and for a short while afterward. If you vacuum, use a dust mask from a hardware store, a double-layered or microfilter vacuum cleaner bag, or a vacuum cleaner with a HEPA filter.  Replace carpet with wood, tile, or vinyl flooring. Carpet can trap dander and dust.  Use allergy-proof pillows, mattress covers, and box spring covers.  Wash bed sheets and blankets every week in hot water and dry them in a dryer.  Use blankets that are made of polyester or cotton.  Clean bathrooms and kitchens with bleach. If possible, have someone repaint the walls in these rooms with mold-resistant paint. Keep out of the rooms that are being cleaned and painted.  Wash hands frequently. SEEK MEDICAL CARE IF:   You have wheezing, shortness of  breath, or a cough even if taking  medicine to prevent attacks.  The colored mucus you cough up (sputum) is thicker than usual.  Your sputum changes from clear or white to yellow, green, gray, or bloody.  You have any problems that may be related to the medicines you are taking (such as a rash, itching, swelling, or trouble breathing).  You are using a reliever medicine more than 2-3 times per week.  Your peak flow is still at 50-79% of your personal best after following your action plan for 1 hour.  You have a fever. SEEK IMMEDIATE MEDICAL CARE IF:   You seem to be getting worse and are unresponsive to treatment during an asthma attack.  You are short of breath even at rest.  You get short of breath when doing very little physical activity.  You have difficulty eating, drinking, or talking due to asthma symptoms.  You develop chest pain.  You develop a fast heartbeat.  You have a bluish color to your lips or fingernails.  You are light-headed, dizzy, or faint.  Your peak flow is less than 50% of your personal best.   This information is not intended to replace advice given to you by your health care provider. Make sure you discuss any questions you have with your health care provider.   Document Released: 11/27/2005 Document Revised: 08/18/2015 Document Reviewed: 06/26/2013 Elsevier Interactive Patient Education 2016 [employer].   Social Anxiety Disorder Social anxiety disorder, previously called social phobia, is a mental disorder. People with social anxiety disorder frequently feel nervous, afraid, or embarrassed when around other people in social situations. They constantly worry that other people are judging or criticizing them for how they look, what they say, or how they act. They may worry that other people might reject them because of their appearance or behavior. Social anxiety disorder is more than just occasional shyness or self-consciousness. It can cause severe emotional distress. It can  interfere with daily life activities. Social anxiety disorder also may lead to excessive alcohol or drug use and even suicide.  Social anxiety disorder is actually one of the most common mental disorders. It can develop at any time but usually starts in the teenage years. Women are more commonly affected than men. Social anxiety disorder is also more common in people who have family members with anxiety disorders. It also is more common in people who have physical deformities or conditions with characteristics that are obvious to others, such as stuttered speech or movement abnormalities (Parkinson disease).  SYMPTOMS  In addition to feeling anxious or fearful in social situations, people with social anxiety disorder frequently have physical symptoms. Examples include:  Red face (blushing).  Racing heart.  Sweating.  Shaky hands or voice.  Confusion.  Light-headedness.  Upset stomach and diarrhea. DIAGNOSIS  Social anxiety disorder is diagnosed through an assessment by your health care provider. Your health care provider will ask you questions about your mood, thoughts, and reactions in social situations. Your health care provider may ask you about your medical history and use of alcohol or drugs, including prescription medicines. Certain medical conditions and the use of certain substances, including caffeine, can cause symptoms similar to social anxiety disorder. Your health care provider may refer you to a mental health specialist for further evaluation or treatment. The criteria for diagnosis of social anxiety disorder are:  Marked fear or anxiety in one or more social situations in which you may be closely watched or studied by others.  Examples of such situations include:  Interacting socially (having a conversation with others, going to a party, or meeting strangers).  Being observed (eating or drinking in public or being called on in class).  Performing in front of others (giving a  speech).  The social situations of concern almost always cause fear or anxiety, not just occasionally.  People with social anxiety disorder fear that they will be viewed negatively in a way that will be embarrassing, will lead to rejection, or will offend others. This fear is out of proportion to the actual threat posed by the social situation.  Often the triggering social situations are avoided, or they are endured with intense fear or anxiety. The fear, anxiety, or avoidance is persistent and lasts for 6 months or longer.  The anxiety causes difficulty functioning in at least some parts of your daily life. TREATMENT  Several types of treatment are available for social anxiety disorder. These treatments are often used in combination and include:   Talk therapy. Group talk therapy allows you to see that you are not alone with these problems. Individual talk therapy helps you address your specific anxiety issues with a caring professional. The most effective forms of talk therapy for social anxiety disorder are cognitive-behavioral therapy and exposure therapy. Cognitive-behavioral therapy helps you to identify and change negative thoughts and beliefs that are at the root of the disorder. Exposure therapy allows you to gradually face the situations that you fear most.  Relaxation and coping techniques. These include deep breathing, self-talk, meditation, visual imagery, and yoga. Relaxation techniques help to keep you calm in social situations.  Social Optician, dispensingskills training.Social skills can be learned on your own or with the help of a talk therapist. They can help you feel more confident and comfortable in social situations.  Medicine. For anxiety limited to performance situations (performance anxiety), medicine called beta blockers can help by reducing or preventing the physical symptoms of social anxiety disorder. For more persistent and generalized social anxiety, antidepressant medicine may be  prescribed to help control symptoms. In severe cases of social anxiety disorder, strong antianxiety medicine, called benzodiazepines, may be prescribed on a limited basis and for a short time.   This information is not intended to replace advice given to you by your health care provider. Make sure you discuss any questions you have with your health care provider.   Document Released: 10/26/2005 Document Revised: 12/18/2014 Document Reviewed: 02/25/2013 Elsevier Interactive Patient Education [employer] Elsevier Inc.      I personally performed the services described in this documentation, which was scribed in my presence. The recorded information has been reviewed and considered, and addended by me as needed.    By signing my name below, I, Javier Dockerobert Ryan Halas, attest that this documentation has been prepared under the direction and in the presence of Meredith StaggersJeffrey Shenia Alan, MD. Electronically Signed: Javier Dockerobert Ryan Halas, ER Scribe. 10/26/2015. 6:58 PM.

## 2015-12-24 ENCOUNTER — Telehealth: Payer: Self-pay | Admitting: Family Medicine

## 2015-12-27 ENCOUNTER — Encounter: Payer: Self-pay | Admitting: Family Medicine

## 2015-12-27 ENCOUNTER — Ambulatory Visit (INDEPENDENT_AMBULATORY_CARE_PROVIDER_SITE_OTHER): Payer: Managed Care, Other (non HMO) | Admitting: Family Medicine

## 2015-12-27 VITALS — BP 134/87 | HR 80 | Temp 98.0°F | Resp 16 | Ht 68.0 in | Wt 163.0 lb

## 2015-12-27 DIAGNOSIS — F4010 Social phobia, unspecified: Secondary | ICD-10-CM | POA: Diagnosis not present

## 2015-12-27 DIAGNOSIS — J4540 Moderate persistent asthma, uncomplicated: Secondary | ICD-10-CM | POA: Diagnosis not present

## 2015-12-27 MED ORDER — FLUOXETINE HCL 20 MG PO TABS: 20.0000 mg | ORAL_TABLET | Freq: Every day | ORAL | Status: DC

## 2015-12-27 MED ORDER — BECLOMETHASONE DIPROPIONATE 80 MCG/ACT IN AERS: 1.0000 | INHALATION_SPRAY | Freq: Two times a day (BID) | RESPIRATORY_TRACT | Status: DC

## 2015-12-27 NOTE — Progress Notes (Signed)
Subjective:  By signing my name below, I, Dylan Rodgers, attest that this documentation has been prepared under the direction and in the presence of Meredith StaggersJeffrey Ardith Test, MD.  Electronically Signed: Andrew Auaven Rodgers, ED Scribe. 12/27/2015. 10:28 AM.  Patient ID: Dylan Rodgers, male    DOB: 1951/11/23, 65 y.o.   MRN: [id number]  HPI   Chief Complaint  Patient presents with  . Medication Management  . Shortness of Breath    "several months"  . Follow-up    asthma   HPI Comments: Dylan Rodgers is a 65 y.o. male who presents to the [facility]  For a follow up.  Asthma Last discussed in November. 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 Had been using OTC bronhcaid. Some increased dust at work may have contributed. Poss asthma and COPD with hx. Restarted qvar 80 MCG/ACT twice a day with albuterol as need.   Pt was was initially using albuterol once a day but in the past month he has not used it at all. He is still using qvar. He has cut back on cycling, only riding 3 miles a couple times a week and has started lifting weights. He denies thrush symptoms.   Social anxiety disorder.  See last ov. This apparently has been a long standing issues without treatment other than cycling which helps to manage his symptoms. Did agree on trying prozac last visit, started 20 mg qd and recommended counseling plus or minus xanax or specific performance anxiety.   Pt has been doing well with prozac. He has noticed improvement with anxiety during social situation, feeling more comfortable with a groups of people, though he did not speak. Pt denies thoughts of hurting or harming himself as well as depression.   There are no active problems to display for this patient.  Past Medical History  Diagnosis Date  . Asthma    No past surgical history on file. No Known Allergies Prior to Admission medications   Medication Sig Start Date End Date Taking? Authorizing Provider  albuterol (PROVENTIL  HFA;VENTOLIN HFA) 108 (90 BASE) MCG/ACT inhaler Inhale 1-2 puffs into the lungs every 4 (four) hours as needed for wheezing or shortness of breath. 10/26/15   Shade FloodJeffrey R Koron Godeaux, MD  beclomethasone (QVAR) 80 MCG/ACT inhaler Inhale 1 puff into the lungs 2 (two) times daily. 10/26/15   Shade FloodJeffrey R Sierra Bissonette, MD  calcium carbonate (OS-CAL) 600 MG TABS tablet Take 600 mg by mouth 2 (two) times daily with a meal.    Historical Provider, MD  cholecalciferol (VITAMIN D) 1000 UNITS tablet Take 1,000 Units by mouth daily.    Historical Provider, MD  FLUoxetine (PROZAC) 20 MG tablet Take 1 tablet (20 mg total) by mouth daily. 10/26/15   Shade FloodJeffrey R Yordan Martindale, MD  Multiple Vitamin (MULTIVITAMIN WITH MINERALS) TABS tablet Take 1 tablet by mouth daily.    Historical Provider, MD  vitamin B-12 (CYANOCOBALAMIN) 1000 MCG tablet Take 1,000 mcg by mouth daily.    Historical Provider, MD  vitamin C (ASCORBIC ACID) 500 MG tablet Take 500 mg by mouth daily.    Historical Provider, MD  zinc sulfate 220 MG capsule Take 220 mg by mouth daily.    Historical Provider, MD   Social History   Social History  . Marital Status: Married    Spouse Name: N/A  . Number of Children: N/A  . Years of Education: N/A   Occupational History  . Not on file.   Social History Main Topics  .  Smoking status: Former Smoker -- 0.50 packs/day    Types: Cigarettes    Quit date: 11/16/2013  . Smokeless tobacco: Never Used  . Alcohol Use: No  . Drug Use: Yes    Special: Marijuana  . Sexual Activity: Not on file   Other Topics Concern  . Not on file   Social History Narrative   Review of Systems  Psychiatric/Behavioral: Negative for suicidal ideas, self-injury and dysphoric mood. The patient is nervous/anxious.    Objective:   Physical Exam  Constitutional: He is oriented to person, place, and time. He appears well-developed and well-nourished. No distress.  HENT:  Head: Normocephalic and atraumatic.  Eyes: Conjunctivae and EOM are  normal.  Neck: Neck supple.  Cardiovascular: Normal rate.   Pulmonary/Chest: Effort normal.  Musculoskeletal: Normal range of motion.  Neurological: He is alert and oriented to person, place, and time.  Skin: Skin is warm and dry.  Psychiatric: He has a normal mood and affect. His behavior is normal.  Nursing note and vitals reviewed.   Filed Vitals:   12/27/15 1027 12/27/15 1028  BP: 153/89 134/87  Pulse: 83 80  Temp: 98 F (36.7 C)   Resp: 16   Height: 5\' 8"  (1.727 m)   Weight: 163 lb (73.936 kg)    Assessment & Plan:   Dylan Rodgers is a 65 y.o. male Asthma, moderate persistent, uncomplicated - Plan: beclomethasone (QVAR) 80 MCG/ACT inhaler  - improved on Qvar, continue same dose,  albuterol as needed. rtc precautions.   Social anxiety disorder - Plan: FLUoxetine (PROZAC) 20 MG tablet  - improved on prozac. Continue same meds, declined counseling at present.    Plan on CPE, health maintenance items to be discussed further at that visit.   Meds ordered this encounter  Medications  . beclomethasone (QVAR) 80 MCG/ACT inhaler    Sig: Inhale 1 puff into the lungs 2 (two) times daily.    Dispense:  3 Inhaler    Refill:  1  . FLUoxetine (PROZAC) 20 MG tablet    Sig: Take 1 tablet (20 mg total) by mouth daily.    Dispense:  90 tablet    Refill:  1   Patient Instructions  I'm glad to hear you are doing well. No change in medications for now. Follow up in 6 months. Return to the clinic or go to the nearest emergency room if any of your symptoms worsen or new symptoms occur.    I personally performed the services described in this documentation, which was scribed in my presence. The recorded information has been reviewed and considered, and addended by me as needed.

## 2015-12-27 NOTE — Patient Instructions (Signed)
I'm glad to hear you are doing well. No change in medications for now. Follow up in 6 months. Return to the clinic or go to the nearest emergency room if any of your symptoms worsen or new symptoms occur.

## 2016-04-27 ENCOUNTER — Other Ambulatory Visit: Payer: Self-pay

## 2016-04-27 DIAGNOSIS — J4540 Moderate persistent asthma, uncomplicated: Secondary | ICD-10-CM

## 2016-04-27 MED ORDER — BECLOMETHASONE DIPROPIONATE 80 MCG/ACT IN AERS: 1.0000 | INHALATION_SPRAY | Freq: Two times a day (BID) | RESPIRATORY_TRACT | Status: DC

## 2016-06-28 ENCOUNTER — Ambulatory Visit: Payer: Managed Care, Other (non HMO) | Admitting: Family Medicine

## 2016-06-29 ENCOUNTER — Encounter: Payer: Self-pay | Admitting: Family Medicine

## 2016-06-29 ENCOUNTER — Ambulatory Visit (INDEPENDENT_AMBULATORY_CARE_PROVIDER_SITE_OTHER): Payer: Managed Care, Other (non HMO) | Admitting: Family Medicine

## 2016-06-29 VITALS — BP 140/80 | HR 64 | Temp 98.6°F | Resp 16 | Ht 68.0 in | Wt 170.2 lb

## 2016-06-29 DIAGNOSIS — J4520 Mild intermittent asthma, uncomplicated: Secondary | ICD-10-CM

## 2016-06-29 DIAGNOSIS — J4540 Moderate persistent asthma, uncomplicated: Secondary | ICD-10-CM

## 2016-06-29 DIAGNOSIS — IMO0001 Reserved for inherently not codable concepts without codable children: Secondary | ICD-10-CM

## 2016-06-29 DIAGNOSIS — F4010 Social phobia, unspecified: Secondary | ICD-10-CM

## 2016-06-29 MED ORDER — BECLOMETHASONE DIPROPIONATE 80 MCG/ACT IN AERS: 1.0000 | INHALATION_SPRAY | Freq: Two times a day (BID) | RESPIRATORY_TRACT | Status: DC

## 2016-06-29 MED ORDER — FLUOXETINE HCL 20 MG PO TABS: 20.0000 mg | ORAL_TABLET | Freq: Every day | ORAL | Status: DC

## 2016-06-29 NOTE — Progress Notes (Signed)
By signing my name below, I, Mesha Guinyard, attest that this documentation has been prepared under the direction and in the presence of Meredith StaggersJeffrey Penny Frisbie, MD.  Electronically Signed: Arvilla MarketMesha Guinyard, Medical Scribe. 06/29/2016. 11:09 AM.  Subjective:    Patient ID: Dylan Rodgers, male    DOB: March 28, 1951, 65 y.o.   MRN: [id number]  HPI Chief Complaint  Patient presents with  . Follow-up    medication follow up for Qvar and Fluoxetine     HPI Comments: Dylan Rodgers is a 65 y.o. male with a PMHx of asthma, and social anxiety disorder who presents to the [facility] for asthma and anxiety follow-up. Pt states he lost weight a while ago from not eating much, but his weight and appetite has gone back to normal since.  Asthma: Last seen January 2016 had improved on Qvar, continued same dose. Pt uses Qvar BID, pt hasn't had to use the Albuterol since. Pt bikes about 1-2 hours for 2-3 times a week. Pt walks with his wife about 1 hour a day.  Social Anxiety Disorder: Started Prozac November 2016- doing well after January visit continued same dose. Pt takes Prozac QD. Pt states he  Doing great while on it. Pt had some anxiety last week at work and states it's more of a mental issue now. Pt didn't experience diaphoresis, but was nervous because he expected it. Pt experiences more dreams than expected- no nightmares. Pt has some libido troubles- pt states it's not a big issue but is willing to get help if it gets serious. Pt denies suicidal ideation, thoughts of self harm, and thoughts of harming others.  Pts dad is going to be [age] soon and his dad has never been in the hospital.  There are no active problems to display for this patient.  Past Medical History  Diagnosis Date  . Asthma    No past surgical history on file. No Known Allergies Prior to Admission medications   Medication Sig Start Date End Date Taking? Authorizing Provider  albuterol (PROVENTIL HFA;VENTOLIN HFA)  108 (90 BASE) MCG/ACT inhaler Inhale 1-2 puffs into the lungs every 4 (four) hours as needed for wheezing or shortness of breath. 10/26/15  Yes Shade FloodJeffrey R Whyatt Klinger, MD  beclomethasone (QVAR) 80 MCG/ACT inhaler Inhale 1 puff into the lungs 2 (two) times daily. 04/27/16  Yes Shade FloodJeffrey R Asta Corbridge, MD  calcium carbonate (OS-CAL) 600 MG TABS tablet Take 600 mg by mouth 2 (two) times daily with a meal.   Yes Historical Provider, MD  cholecalciferol (VITAMIN D) 1000 UNITS tablet Take 1,000 Units by mouth daily.   Yes Historical Provider, MD  FLUoxetine (PROZAC) 20 MG tablet Take 1 tablet (20 mg total) by mouth daily. 12/27/15  Yes Shade FloodJeffrey R Royann Wildasin, MD  Multiple Vitamin (MULTIVITAMIN WITH MINERALS) TABS tablet Take 1 tablet by mouth daily.   Yes Historical Provider, MD  vitamin B-12 (CYANOCOBALAMIN) 1000 MCG tablet Take 1,000 mcg by mouth daily.   Yes Historical Provider, MD  vitamin C (ASCORBIC ACID) 500 MG tablet Take 500 mg by mouth daily.   Yes Historical Provider, MD  zinc sulfate 220 MG capsule Take 220 mg by mouth daily.   Yes Historical Provider, MD   Social History   Social History  . Marital Status: Married    Spouse Name: N/A  . Number of Children: N/A  . Years of Education: N/A   Occupational History  . Not on file.   Social History Main Topics  .  Smoking status: Former Smoker -- 0.50 packs/day    Types: Cigarettes    Quit date: 11/16/2013  . Smokeless tobacco: Never Used  . Alcohol Use: No  . Drug Use: Yes    Special: Marijuana  . Sexual Activity: Not on file   Other Topics Concern  . Not on file   Social History Narrative   Review of Systems  Constitutional: Negative for diaphoresis, appetite change and unexpected weight change.  Psychiatric/Behavioral: Negative for suicidal ideas, sleep disturbance and self-injury.    Objective:  BP 140/80 mmHg  Pulse 64  Temp(Src) 98.6 F (37 C) (Oral)  Resp 16  Ht 5\' 8"  (1.727 m)  Wt 170 lb 3.2 oz (77.202 kg)  BMI 25.88 kg/m2  SpO2  97%  Physical Exam  Constitutional: He is oriented to person, place, and time. He appears well-developed and well-nourished.  HENT:  Head: Normocephalic and atraumatic.  Eyes: EOM are normal. Pupils are equal, round, and reactive to light.  Neck: No JVD present. Carotid bruit is not present.  Cardiovascular: Normal rate, regular rhythm and normal heart sounds.   No murmur heard. Pulmonary/Chest: Effort normal and breath sounds normal. He has no rales.  Musculoskeletal: He exhibits no edema.  Neurological: He is alert and oriented to person, place, and time.  Skin: Skin is warm and dry.  Psychiatric: He has a normal mood and affect.  Vitals reviewed.   Assessment & Plan:   Dylan Rodgers is a 65 y.o. male Asthma, moderate persistent, uncomplicated - Plan: beclomethasone (QVAR) 80 MCG/ACT inhaler  - Able. Continue Qvar 80 g twice a day. Has albuterol if needed  Social anxiety disorder - Plan: FLUoxetine (PROZAC) 20 MG tablet  -Improved, stable on Prozac. No change in dosing. Recheck 6 months.  Meds ordered this encounter  Medications  . beclomethasone (QVAR) 80 MCG/ACT inhaler    Sig: Inhale 1 puff into the lungs 2 (two) times daily.    Dispense:  3 Inhaler    Refill:  1  . FLUoxetine (PROZAC) 20 MG tablet    Sig: Take 1 tablet (20 mg total) by mouth daily.    Dispense:  90 tablet    Refill:  1   Patient Instructions   Glad to hear things are going well! Continue Qvar, continue Prozac at same dose. Follow-up with me in 6 months for a physical, and we can discuss different testing at that time if needed. If you have any questions in the meantime, let me know.     IF you received an x-ray today, you will receive an invoice from [facility]. Please contact [facility] at [phone] with questions or concerns regarding your invoice.   IF you received labwork today, you will receive an invoice from [employer] [facility]. Please contact  [facility] at [phone] with questions or concerns regarding your invoice.   Our billing staff will not be able to assist you with questions regarding bills from these companies.  You will be contacted with the lab results as soon as they are available. The fastest way to get your results is to activate your My Chart account. Instructions are located on the last page of this paperwork. If you have not heard from [place] regarding the results in 2 weeks, please contact this office.         I personally performed the services described in this documentation, which was scribed in my presence. The recorded information has been reviewed and considered, and addended by me  as needed.   Signed,   [name], MD [facility].  06/30/2016 2:28 PM

## 2016-06-29 NOTE — Patient Instructions (Addendum)
Glad to hear things are going well! Continue Qvar, continue Prozac at same dose. Follow-up with me in 6 months for a physical, and we can discuss different testing at that time if needed. If you have any questions in the meantime, let me know.     IF you received an x-ray today, you will receive an invoice from [facility]. Please contact [facility] at [phone] with questions or concerns regarding your invoice.   IF you received labwork today, you will receive an invoice from [facility]. Please contact [facility] at [phone] with questions or concerns regarding your invoice.   Our billing staff will not be able to assist you with questions regarding bills from these companies.  You will be contacted with the lab results as soon as they are available. The fastest way to get your results is to activate your My Chart account. Instructions are located on the last page of this paperwork. If you have not heard from us regarding the results in 2 weeks, please contact this office.

## 2016-06-30 DIAGNOSIS — J4520 Mild intermittent asthma, uncomplicated: Secondary | ICD-10-CM | POA: Insufficient documentation

## 2016-06-30 DIAGNOSIS — IMO0001 Reserved for inherently not codable concepts without codable children: Secondary | ICD-10-CM | POA: Insufficient documentation

## 2016-06-30 DIAGNOSIS — F4010 Social phobia, unspecified: Secondary | ICD-10-CM | POA: Insufficient documentation

## 2016-07-14 ENCOUNTER — Other Ambulatory Visit: Payer: Self-pay | Admitting: Family Medicine

## 2016-07-14 DIAGNOSIS — F4010 Social phobia, unspecified: Secondary | ICD-10-CM

## 2017-01-10 ENCOUNTER — Other Ambulatory Visit: Payer: Self-pay | Admitting: Family Medicine

## 2017-01-10 DIAGNOSIS — F4010 Social phobia, unspecified: Secondary | ICD-10-CM

## 2017-01-10 NOTE — Telephone Encounter (Signed)
Please schedule an appt within 6 months for the patient with Dr Neva SeatGreene

## 2017-02-11 ENCOUNTER — Other Ambulatory Visit: Payer: Self-pay | Admitting: Family Medicine

## 2017-02-11 DIAGNOSIS — J4540 Moderate persistent asthma, uncomplicated: Secondary | ICD-10-CM

## 2017-02-12 NOTE — Telephone Encounter (Signed)
Done

## 2017-02-13 ENCOUNTER — Telehealth: Payer: Self-pay

## 2017-02-13 DIAGNOSIS — J4540 Moderate persistent asthma, uncomplicated: Secondary | ICD-10-CM

## 2017-02-13 MED ORDER — BECLOMETHASONE DIPROPIONATE 80 MCG/ACT IN AERS: INHALATION_SPRAY | RESPIRATORY_TRACT | 0 refills | Status: DC

## 2017-02-13 NOTE — Telephone Encounter (Signed)
Now insurance will not cover qvar hfa or redihaler, needs sub flovent hfa or disc or arnuty  Please rx if appropriate to sub

## 2017-02-14 MED ORDER — FLUTICASONE PROPIONATE HFA 44 MCG/ACT IN AERO: 2.0000 | INHALATION_SPRAY | Freq: Two times a day (BID) | RESPIRATORY_TRACT | 11 refills | Status: DC

## 2017-02-14 NOTE — Telephone Encounter (Signed)
Noted. flovent ordered.

## 2017-02-14 NOTE — Addendum Note (Signed)
Addended by: Meredith StaggersGREENE, Lenyx Boody R on: 02/14/2017 11:14 AM   Modules accepted: Orders

## 2017-02-22 ENCOUNTER — Encounter: Payer: Self-pay | Admitting: Family Medicine

## 2017-02-22 ENCOUNTER — Ambulatory Visit (INDEPENDENT_AMBULATORY_CARE_PROVIDER_SITE_OTHER): Payer: Managed Care, Other (non HMO) | Admitting: Family Medicine

## 2017-02-22 VITALS — BP 149/92 | HR 75 | Temp 98.2°F | Resp 18 | Ht 68.0 in | Wt 177.8 lb

## 2017-02-22 DIAGNOSIS — Z13220 Encounter for screening for lipoid disorders: Secondary | ICD-10-CM

## 2017-02-22 DIAGNOSIS — J4520 Mild intermittent asthma, uncomplicated: Secondary | ICD-10-CM

## 2017-02-22 DIAGNOSIS — Z114 Encounter for screening for human immunodeficiency virus [HIV]: Secondary | ICD-10-CM

## 2017-02-22 DIAGNOSIS — Z0000 Encounter for general adult medical examination without abnormal findings: Secondary | ICD-10-CM | POA: Diagnosis not present

## 2017-02-22 DIAGNOSIS — F4010 Social phobia, unspecified: Secondary | ICD-10-CM | POA: Diagnosis not present

## 2017-02-22 DIAGNOSIS — Z125 Encounter for screening for malignant neoplasm of prostate: Secondary | ICD-10-CM | POA: Diagnosis not present

## 2017-02-22 DIAGNOSIS — Z23 Encounter for immunization: Secondary | ICD-10-CM | POA: Diagnosis not present

## 2017-02-22 DIAGNOSIS — Z1211 Encounter for screening for malignant neoplasm of colon: Secondary | ICD-10-CM | POA: Diagnosis not present

## 2017-02-22 DIAGNOSIS — H6120 Impacted cerumen, unspecified ear: Secondary | ICD-10-CM | POA: Diagnosis not present

## 2017-02-22 DIAGNOSIS — Z1159 Encounter for screening for other viral diseases: Secondary | ICD-10-CM

## 2017-02-22 MED ORDER — FLUOXETINE HCL 20 MG PO CAPS: 20.0000 mg | ORAL_CAPSULE | Freq: Every day | ORAL | 1 refills | Status: DC

## 2017-02-22 MED ORDER — ZOSTER VACCINE LIVE 19400 UNT/0.65ML ~~LOC~~ SUSR: 0.6500 mL | Freq: Once | SUBCUTANEOUS | 0 refills | Status: AC

## 2017-02-22 NOTE — Progress Notes (Addendum)
By signing my name below, I, Mesha Guinyard, attest that this documentation has been prepared under the direction and in the presence of [name], MD.  Electronically Signed: Arvilla Market, Medical Scribe. 02/22/17. 3:11 PM.  Subjective:    Patient ID: Dylan Rodgers, male    DOB: 1951-06-02, 66 y.o.   MRN: [id number]  HPI Chief Complaint  Patient presents with  . Annual Exam    CPE    HPI Comments: Dylan Rodgers is a 66 y.o. male with a PMHx of asthma and social anxiety disorder who presents to the [facility] for his annual physical. Fasting blood work this morning.  Social Anxiety Disorder: Started on prozac Nov 2016. Improved with prozac, some decreased in libido, but overall tolerating medication last visit. Pt is compliant with prozac with 1-2 tablets a day and has found great relief of his anxiety. Pt doesn't have a issue with individually talking with a customer, but has trouble with group speaking or speaking at concerts or packed areas. Reports his libido is still low, but he's "willing to take that trade off" and he can still achieve erection, and orgasm.  Asthma: Last discussed July 2017, moderate persistent. Much improved with Qvar BID. No recent use of albuterol at that time. Recent change in insurance coverage, flovent ordered in place of Qvar. Pt is compliant with Qvar BID with relief to his asthma. Mentions he's had thrush 1-2x and they have always resolved within a day. Pt has 2 new Qvar and 1 of them is half used. Pt hasn't had to use albuterol in the past year.   Cancer Screening: Prostate CA: He has not had a prostate exam and agrees to prostate CA screening with DRE and blood work. Reports nocturia occurring 1x a night. No FHx of prostate CA.  No results found for: PSA Colon CA: Colonoscopy has never been performed and would like a referral. Skin CA: Denies moles or skin tags taken off. FHx: Dad had skin CA.  Immunizations: Pt  recieved his flu shot Oct 2017 at [place]. Pt suspects it's been more than 10 years since his last tdap. Pt has not had his shingles vaccine yet.  There is no immunization history on file for this patient.  Vision: Pt is not followed by an ophthalmologist and reports it's been years since his last appt.  Visual Acuity Screening   Right eye Left eye Both eyes  Without correction: 20/20 20/20 20/20   With correction:      Dentist: Pt has top and bottom dentures.  Exercise: Pt stopped biking since 06/29/16 and he now walks 4 miles a day with his wife for about an hour a day for the last 6 months. Pt also has been picking up 15 lbs bags an hour a day, 150 reps with 14 -15 bars lbs, and using bow flex. Denies back issues, SOB, chest pain.   Hep C/HIV screening: Pt has never been screened for either but would like to get screened for both.   Depression Screening: Depression screen [facility] 2/9 02/22/2017 06/29/2016 12/27/2015 10/26/2015  Decreased Interest 0 0 0 1  Down, Depressed, Hopeless 0 0 0 3  PHQ - 2 Score 0 0 0 4  Altered sleeping - - - 3  Tired, decreased energy - - - 2  Change in appetite - - - 1  Feeling bad or failure about yourself  - - - 1  Trouble concentrating - - - 0  Moving slowly  or fidgety/restless - - - 0  Suicidal thoughts - - - 0  PHQ-9 Score - - - 11  Difficult doing work/chores - - - Somewhat difficult    Patient Active Problem List   Diagnosis Date Noted  . Moderate intermittent asthma without complication 06/30/2016  . Social anxiety disorder 06/30/2016   Past Medical History:  Diagnosis Date  . Asthma    History reviewed. No pertinent surgical history. No Known Allergies Prior to Admission medications   Medication Sig Start Date End Date Taking? Authorizing Provider  albuterol (PROVENTIL HFA;VENTOLIN HFA) 108 (90 BASE) MCG/ACT inhaler Inhale 1-2 puffs into the lungs every 4 (four) hours as needed for wheezing or shortness of breath. 10/26/15   Shade Flood, MD  beclomethasone (QVAR) 80 MCG/ACT inhaler QVAR REDIHALER INHALE 1 PUFF INTO THE LUNGS TWICE A DAY 02/13/17   Shade Flood, MD  calcium carbonate (OS-CAL) 600 MG TABS tablet Take 600 mg by mouth 2 (two) times daily with a meal.    Historical Provider, MD  cholecalciferol (VITAMIN D) 1000 UNITS tablet Take 1,000 Units by mouth daily.    Historical Provider, MD  FLUoxetine (PROZAC) 20 MG capsule TAKE 1 CAPSULE EVERY DAY 01/10/17   Morrell Riddle, PA-C  fluticasone (FLOVENT HFA) 44 MCG/ACT inhaler Inhale 2 puffs into the lungs 2 (two) times daily. 02/14/17   Shade Flood, MD  Multiple Vitamin (MULTIVITAMIN WITH MINERALS) TABS tablet Take 1 tablet by mouth daily.    Historical Provider, MD  vitamin B-12 (CYANOCOBALAMIN) 1000 MCG tablet Take 1,000 mcg by mouth daily.    Historical Provider, MD  vitamin C (ASCORBIC ACID) 500 MG tablet Take 500 mg by mouth daily.    Historical Provider, MD  zinc sulfate 220 MG capsule Take 220 mg by mouth daily.    Historical Provider, MD   Social History   Social History  . Marital status: Married    Spouse name: N/A  . Number of children: N/A  . Years of education: N/A   Occupational History  . Not on file.   Social History Main Topics  . Smoking status: Former Smoker    Packs/day: 0.50    Types: Cigarettes    Quit date: 11/16/2013  . Smokeless tobacco: Never Used  . Alcohol use No  . Drug use: Yes    Types: Marijuana  . Sexual activity: Not on file   Other Topics Concern  . Not on file   Social History Narrative  . No narrative on file   Review of Systems  Respiratory: Positive for shortness of breath (chronic with asthma, although it's been controlled).   Psychiatric/Behavioral: The patient is nervous/anxious (chronic).   13 point ROS positive for the above, otherwise negative. Objective:  Physical Exam  Constitutional: He is oriented to person, place, and time. He appears well-developed and well-nourished.  HENT:  Head:  Normocephalic and atraumatic.  Right Ear: External ear normal.  Left Ear: External ear normal.  Mouth/Throat: Oropharynx is clear and moist.  Moderate cerumen bilaterally  Eyes: Conjunctivae and EOM are normal. Pupils are equal, round, and reactive to light.  Neck: Normal range of motion. Neck supple. No thyromegaly present.  Cardiovascular: Normal rate, regular rhythm, normal heart sounds and intact distal pulses.   Pulmonary/Chest: Effort normal and breath sounds normal. No respiratory distress. He has no wheezes.  Abdominal: Soft. He exhibits no distension. There is no tenderness. Hernia confirmed negative in the right inguinal area and confirmed negative in  the left inguinal area.  Genitourinary: Prostate normal.  Musculoskeletal: Normal range of motion. He exhibits no edema or tenderness.  Lymphadenopathy:    He has no cervical adenopathy.  Neurological: He is alert and oriented to person, place, and time. He has normal reflexes.  Skin: Skin is warm and dry.  Psychiatric: He has a normal mood and affect. His behavior is normal.  Vitals reviewed.   Vitals:   02/22/17 1449 02/22/17 1458  BP: (!) 148/89 (!) 149/92  Pulse: 75   Resp: 18   Temp: 98.2 F (36.8 C)   TempSrc: Oral   SpO2: 96%   Weight: 177 lb 12.8 oz (80.6 kg)   Height: 5\' 8"  (1.727 m)    Body mass index is 27.03 kg/m. Assessment & Plan:  Over 40 mins of face to face care during this visit.  Dylan Rodgers is a 66 y.o. male Annual physical exam  --anticipatory guidance as below in AVS, screening labs above. Health maintenance items as above in HPI discussed/recommended as applicable.   Special screening for malignant neoplasms, colon - Plan: Ambulatory referral to Gastroenterology  -refer for colon CA screening  Screening for hyperlipidemia - Plan: Comprehensive metabolic panel, Lipid panel  Need for shingles vaccine - Plan: Zoster Vaccine Live, PF, (ZOSTAVAX) 40981 UNT/0.65ML injection printed to fill at  his pharmacy.   Screening for prostate cancer - Plan: PSA  -We discussed pros and cons of prostate cancer screening, and after this discussion, he chose to have screening done. PSA obtained, and no concerning findings on DRE.   Need for Tdap vaccination - Plan: Tdap vaccine greater than or equal to 7yo IM given  Need for hepatitis C screening test - Plan: Hepatitis C antibody  Encounter for screening for HIV - Plan: HIV antibody  Social anxiety disorder - Plan: FLUoxetine (PROZAC) 20 MG capsule  - Overall stable. Discussed other SSRIs to see if less sexual side effects, or adding Wellbutrin. Declined any changes at this time. Continue Prozac same dose.  Asthma  -Recent change from Qvar to Flovent. Anticipate just as good of control, but advised may need to adjust dosing. RTC precautions given if worsening control.   Cerumen impaction.  -Advised to return when he has time for lavage, if not improved with over-the-counter treatments.  Meds ordered this encounter  Medications  . Zoster Vaccine Live, PF, (ZOSTAVAX) 19147 UNT/0.65ML injection    Sig: Inject 19,400 Units into the skin once.    Dispense:  1 each    Refill:  0  . FLUoxetine (PROZAC) 20 MG capsule    Sig: Take 1 capsule (20 mg total) by mouth daily.    Dispense:  90 capsule    Refill:  1   Patient Instructions   Try flovent inhaler as that will likely work just as well, and may be less costly. If needed, can change dose or change to different inhaler if needed. If albuterol is expired - let me know and I can send in a new prescription.   Schedule appointment with eye care provider.   I will refer you for colonoscopy.   Shingles vaccine can be given at your pharmacy  No change in prozac dose for now. We have the option of adding Wellbutrin if continued decreased libido or sexual side effects.   return at your convenience to have cerumen removed form ears.  You can also to Debrox over the counter.   Return to the  clinic or go to the nearest  emergency room if any of your symptoms worsen or new symptoms occur.    Earwax Buildup Your ears make a substance called earwax. It may also be called cerumen. Sometimes, too much earwax builds up in your ear canal. This can cause ear pain and make it harder for you to hear. CAUSES This condition is caused by too much earwax production or buildup. RISK FACTORS The following factors may make you more likely to develop this condition:  Cleaning your ears often with swabs.  Having narrow ear canals.  Having earwax that is overly thick or sticky.  Having eczema.  Being dehydrated. SYMPTOMS Symptoms of this condition include:  Reduced hearing.  Ear drainage.  Ear pain.  Ear itch.  A feeling of fullness in the ear or feeling that the ear is plugged.  Ringing in the ear.  Coughing. DIAGNOSIS Your health care provider can diagnose this condition based on your symptoms and medical history. Your health care provider will also do an ear exam to look inside your ear with a scope (otoscope). You may also have a hearing test. TREATMENT Treatment for this condition includes:  Over-the-counter or prescription ear drops to soften the earwax.  Earwax removal by a health care provider. This may be done:  By flushing the ear with body-temperature water.  With a medical instrument that has a loop at the end (earwax curette).  With a suction device. HOME CARE INSTRUCTIONS  Take over-the-counter and prescription medicines only as told by your health care provider.  Do not put any objects, including an ear swab, into your ear. You can clean the opening of your ear canal with a washcloth.  Drink enough water to keep your urine clear or pale yellow.  If you have frequent earwax buildup or you use hearing aids, consider seeing your health care provider every 6-12 months for routine preventive ear cleanings. Keep all follow-up visits as told by your health  care provider. SEEK MEDICAL CARE IF:  You have ear pain.  Your condition does not improve with treatment.  You have hearing loss.  You have blood, pus, or other fluid coming from your ear. This information is not intended to replace advice given to you by your health care provider. Make sure you discuss any questions you have with your health care provider. Document Released: 01/04/2005 Document Revised: 03/20/2016 Document Reviewed: 07/14/2015 Elsevier Interactive Patient Education  2017 [employer]   Keeping you healthy  Get these tests  Blood pressure- Have your blood pressure checked once a year by your healthcare provider.  Normal blood pressure is 120/80  Weight- Have your body mass index (BMI) calculated to screen for obesity.  BMI is a measure of body fat based on height and weight. You can also calculate your own BMI at [email or web address]/.  Cholesterol- Have your cholesterol checked every year.  Diabetes- Have your blood sugar checked regularly if you have high blood pressure, high cholesterol, have a family history of diabetes or if you are overweight.  Screening for Colon Cancer- Colonoscopy starting at age 65.  Screening may begin sooner depending on your family history and other health conditions. Follow up colonoscopy as directed by your Gastroenterologist.  Screening for Prostate Cancer- Both blood work (PSA) and a rectal exam help screen for Prostate Cancer.  Screening begins at age 65 with African-American men and at age 65 with Caucasian men.  Screening may begin sooner depending on your family history.  Take these medicines  Aspirin- One aspirin daily  can help prevent Heart disease and Stroke.  Flu shot- Every fall.  Tetanus- Every 10 years.  Zostavax- Once after the age of 57 to prevent Shingles.  Pneumonia shot- Once after the age of 10; if you are younger than 77, ask your healthcare provider if you need a Pneumonia shot.  Take these  steps  Don't smoke- If you do smoke, talk to your doctor about quitting.  For tips on how to quit, go to www.smokefree.gov or call 1-800-QUIT-NOW.  Be physically active- Exercise 5 days a week for at least 30 minutes.  If you are not already physically active start slow and gradually work up to 30 minutes of moderate physical activity.  Examples of moderate activity include walking briskly, mowing the yard, dancing, swimming, bicycling, etc.  Eat a healthy diet- Eat a variety of healthy food such as fruits, vegetables, low fat milk, low fat cheese, yogurt, lean meant, poultry, fish, beans, tofu, etc. For more information go to www.thenutritionsource.org  Drink alcohol in moderation- Limit alcohol intake to less than two drinks a day. Never drink and drive.  Dentist- Brush and floss twice daily; visit your dentist twice a year.  Depression- Your emotional health is as important as your physical health. If you're feeling down, or losing interest in things you would normally enjoy please talk to your healthcare provider.  Eye exam- Visit your eye doctor every year.  Safe sex- If you may be exposed to a sexually transmitted infection, use a condom.  Seat belts- Seat belts can save your life; always wear one.  Smoke/Carbon Monoxide detectors- These detectors need to be installed on the appropriate level of your home.  Replace batteries at least once a year.  Skin cancer- When out in the sun, cover up and use sunscreen 15 SPF or higher.  Violence- If anyone is threatening you, please tell your healthcare provider.  Living Will/ Health care power of attorney- Speak with your healthcare provider and family.   IF you received an x-ray today, you will receive an invoice from [facility]. Please contact [facility] at [phone] with questions or concerns regarding your invoice.   IF you received labwork today, you will receive an invoice from [place]. Please contact [facility]  at [phone] with questions or concerns regarding your invoice.   Our billing staff will not be able to assist you with questions regarding bills from these companies.  You will be contacted with the lab results as soon as they are available. The fastest way to get your results is to activate your My Chart account. Instructions are located on the last page of this paperwork. If you have not heard from [place] regarding the results in 2 weeks, please contact this office.       I personally performed the services described in this documentation, which was scribed in my presence. The recorded information has been reviewed and considered for accuracy and completeness, addended by me as needed, and agree with information above.  Signed,   [name], MD Primary Care at [facility].  02/25/17 9:38 PM

## 2017-02-22 NOTE — Patient Instructions (Addendum)
Try flovent inhaler as that will likely work just as well, and may be less costly. If needed, can change dose or change to different inhaler if needed. If albuterol is expired - let me know and I can send in a new prescription.   Schedule appointment with eye care provider.   I will refer you for colonoscopy.   Shingles vaccine can be given at your pharmacy  No change in prozac dose for now. We have the option of adding Wellbutrin if continued decreased libido or sexual side effects.   return at your convenience to have cerumen removed form ears.  You can also to Debrox over the counter.   Return to the clinic or go to the nearest emergency room if any of your symptoms worsen or new symptoms occur.    Earwax Buildup Your ears make a substance called earwax. It may also be called cerumen. Sometimes, too much earwax builds up in your ear canal. This can cause ear pain and make it harder for you to hear. CAUSES This condition is caused by too much earwax production or buildup. RISK FACTORS The following factors may make you more likely to develop this condition:  Cleaning your ears often with swabs.  Having narrow ear canals.  Having earwax that is overly thick or sticky.  Having eczema.  Being dehydrated. SYMPTOMS Symptoms of this condition include:  Reduced hearing.  Ear drainage.  Ear pain.  Ear itch.  A feeling of fullness in the ear or feeling that the ear is plugged.  Ringing in the ear.  Coughing. DIAGNOSIS Your health care provider can diagnose this condition based on your symptoms and medical history. Your health care provider will also do an ear exam to look inside your ear with a scope (otoscope). You may also have a hearing test. TREATMENT Treatment for this condition includes:  Over-the-counter or prescription ear drops to soften the earwax.  Earwax removal by a health care provider. This may be done:  By flushing the ear with body-temperature  water.  With a medical instrument that has a loop at the end (earwax curette).  With a suction device. HOME CARE INSTRUCTIONS  Take over-the-counter and prescription medicines only as told by your health care provider.  Do not put any objects, including an ear swab, into your ear. You can clean the opening of your ear canal with a washcloth.  Drink enough water to keep your urine clear or pale yellow.  If you have frequent earwax buildup or you use hearing aids, consider seeing your health care provider every 6-12 months for routine preventive ear cleanings. Keep all follow-up visits as told by your health care provider. SEEK MEDICAL CARE IF:  You have ear pain.  Your condition does not improve with treatment.  You have hearing loss.  You have blood, pus, or other fluid coming from your ear. This information is not intended to replace advice given to you by your health care provider. Make sure you discuss any questions you have with your health care provider. Document Released: 01/04/2005 Document Revised: 03/20/2016 Document Reviewed: 07/14/2015 Elsevier Interactive Patient Education  2017 [employer]   Keeping you healthy  Get these tests  Blood pressure- Have your blood pressure checked once a year by your healthcare provider.  Normal blood pressure is 120/80  Weight- Have your body mass index (BMI) calculated to screen for obesity.  BMI is a measure of body fat based on height and weight. You can also calculate  your own BMI at ProgramCam.[email or web address].  Cholesterol- Have your cholesterol checked every year.  Diabetes- Have your blood sugar checked regularly if you have high blood pressure, high cholesterol, have a family history of diabetes or if you are overweight.  Screening for Colon Cancer- Colonoscopy starting at age 4.  Screening may begin sooner depending on your family history and other health conditions. Follow up colonoscopy as directed by your  Gastroenterologist.  Screening for Prostate Cancer- Both blood work (PSA) and a rectal exam help screen for Prostate Cancer.  Screening begins at age 29 with African-American men and at age 27 with Caucasian men.  Screening may begin sooner depending on your family history.  Take these medicines  Aspirin- One aspirin daily can help prevent Heart disease and Stroke.  Flu shot- Every fall.  Tetanus- Every 10 years.  Zostavax- Once after the age of 5 to prevent Shingles.  Pneumonia shot- Once after the age of 67; if you are younger than 12, ask your healthcare provider if you need a Pneumonia shot.  Take these steps  Don't smoke- If you do smoke, talk to your doctor about quitting.  For tips on how to quit, go to www.smokefree.gov or call 1-800-QUIT-NOW.  Be physically active- Exercise 5 days a week for at least 30 minutes.  If you are not already physically active start slow and gradually work up to 30 minutes of moderate physical activity.  Examples of moderate activity include walking briskly, mowing the yard, dancing, swimming, bicycling, etc.  Eat a healthy diet- Eat a variety of healthy food such as fruits, vegetables, low fat milk, low fat cheese, yogurt, lean meant, poultry, fish, beans, tofu, etc. For more information go to www.thenutritionsource.org  Drink alcohol in moderation- Limit alcohol intake to less than two drinks a day. Never drink and drive.  Dentist- Brush and floss twice daily; visit your dentist twice a year.  Depression- Your emotional health is as important as your physical health. If you're feeling down, or losing interest in things you would normally enjoy please talk to your healthcare provider.  Eye exam- Visit your eye doctor every year.  Safe sex- If you may be exposed to a sexually transmitted infection, use a condom.  Seat belts- Seat belts can save your life; always wear one.  Smoke/Carbon Monoxide detectors- These detectors need to be installed on  the appropriate level of your home.  Replace batteries at least once a year.  Skin cancer- When out in the sun, cover up and use sunscreen 15 SPF or higher.  Violence- If anyone is threatening you, please tell your healthcare provider.  Living Will/ Health care power of attorney- Speak with your healthcare provider and family.   IF you received an x-ray today, you will receive an invoice from [facility]. Please contact [facility] at [phone] with questions or concerns regarding your invoice.   IF you received labwork today, you will receive an invoice from [place]. Please contact [facility] at [phone] with questions or concerns regarding your invoice.   Our billing staff will not be able to assist you with questions regarding bills from these companies.  You will be contacted with the lab results as soon as they are available. The fastest way to get your results is to activate your My Chart account. Instructions are located on the last page of this paperwork. If you have not heard from [place] regarding the results in 2 weeks, please contact this office.

## 2017-02-23 LAB — HEPATITIS C ANTIBODY: Hep C Virus Ab: <0.1 s/co ratio (ref 0.0–0.9)

## 2017-02-23 LAB — COMPREHENSIVE METABOLIC PANEL
A/G RATIO: 1.8 (ref 1.2–2.2)
ALT: 31 IU/L (ref 0–44)
AST: 23 IU/L (ref 0–40)
Albumin: 4.4 g/dL (ref 3.6–4.8)
Alkaline Phosphatase: 58 IU/L (ref 39–117)
BUN/Creatinine Ratio: 19 (ref 10–24)
BUN: 14 mg/dL (ref 8–27)
Bilirubin Total: <0.2 mg/dL (ref 0.0–1.2)
CHLORIDE: 100 mmol/L (ref 96–106)
CO2: 23 mmol/L (ref 18–29)
Calcium: 9.6 mg/dL (ref 8.6–10.2)
Creatinine, Ser: 0.73 mg/dL — ABNORMAL LOW (ref 0.76–1.27)
GFR calc Af Amer: 113 mL/min/{1.73_m2} (ref >59)
GFR, EST NON AFRICAN AMERICAN: 98 mL/min/{1.73_m2} (ref >59)
Globulin, Total: 2.4 g/dL (ref 1.5–4.5)
Glucose: 90 mg/dL (ref 65–99)
Potassium: 4.9 mmol/L (ref 3.5–5.2)
Sodium: 140 mmol/L (ref 134–144)
Total Protein: 6.8 g/dL (ref 6.0–8.5)

## 2017-02-23 LAB — LIPID PANEL
Chol/HDL Ratio: 2.8 ratio units (ref 0.0–5.0)
Cholesterol, Total: 226 mg/dL — ABNORMAL HIGH (ref 100–199)
HDL: 82 mg/dL (ref >39)
LDL Calculated: 117 mg/dL — ABNORMAL HIGH (ref 0–99)
Triglycerides: 134 mg/dL (ref 0–149)
VLDL CHOLESTEROL CAL: 27 mg/dL (ref 5–40)

## 2017-02-23 LAB — HIV ANTIBODY (ROUTINE TESTING W REFLEX): HIV Screen 4th Generation wRfx: NONREACTIVE

## 2017-02-23 LAB — PSA: Prostate Specific Ag, Serum: 1.6 ng/mL (ref 0.0–4.0)

## 2017-04-26 ENCOUNTER — Encounter: Payer: Self-pay | Admitting: Family Medicine

## 2017-07-12 ENCOUNTER — Telehealth: Payer: Self-pay

## 2017-07-12 NOTE — Telephone Encounter (Signed)
Received letter from [facility] Re: Fluoxetine. After doing my part of form, I gave them to Dr. Paralee CancelGreene's assistant for completion.

## 2018-01-02 ENCOUNTER — Other Ambulatory Visit: Payer: Self-pay | Admitting: Family Medicine

## 2018-01-02 DIAGNOSIS — F4010 Social phobia, unspecified: Secondary | ICD-10-CM

## 2018-01-02 NOTE — Telephone Encounter (Signed)
Last OV 02/22/17 with Dr. Neva SeatGreene

## 2018-01-10 ENCOUNTER — Other Ambulatory Visit: Payer: Self-pay | Admitting: Family Medicine

## 2018-01-10 DIAGNOSIS — F4010 Social phobia, unspecified: Secondary | ICD-10-CM

## 2018-01-10 NOTE — Telephone Encounter (Signed)
Patient is requesting a refill of the following medications: Requested Prescriptions   Pending Prescriptions Disp Refills  . FLUoxetine (PROZAC) 20 MG capsule [Pharmacy Med Name: FLUOXETINE HCL 20 MG CAPSULE] 90 capsule 1    Sig: TAKE ONE CAPSULE BY MOUTH EVERY DAY    Date of patient request: 01/10/18 Last office visit: 02/22/17 Date of last refill: 02/22/17  Last refill amount: 90 tabs 1 RF Follow up time period per chart: none at this time

## 2018-01-11 NOTE — Telephone Encounter (Signed)
Fluoxetine refilled, with instructions to schedule follow-up appointment.

## 2018-01-24 ENCOUNTER — Ambulatory Visit (INDEPENDENT_AMBULATORY_CARE_PROVIDER_SITE_OTHER): Payer: Managed Care, Other (non HMO) | Admitting: Family Medicine

## 2018-01-24 ENCOUNTER — Encounter: Payer: Self-pay | Admitting: Family Medicine

## 2018-01-24 VITALS — BP 133/86 | HR 85 | Temp 98.4°F | Resp 18 | Ht 68.0 in | Wt 181.0 lb

## 2018-01-24 DIAGNOSIS — J069 Acute upper respiratory infection, unspecified: Secondary | ICD-10-CM | POA: Diagnosis not present

## 2018-01-24 DIAGNOSIS — F4010 Social phobia, unspecified: Secondary | ICD-10-CM

## 2018-01-24 DIAGNOSIS — J4520 Mild intermittent asthma, uncomplicated: Secondary | ICD-10-CM | POA: Diagnosis not present

## 2018-01-24 MED ORDER — FLUOXETINE HCL 20 MG PO CAPS: 20.0000 mg | ORAL_CAPSULE | Freq: Every day | ORAL | 2 refills | Status: DC

## 2018-01-24 MED ORDER — ALBUTEROL SULFATE HFA 108 (90 BASE) MCG/ACT IN AERS: 1.0000 | INHALATION_SPRAY | RESPIRATORY_TRACT | 0 refills | Status: DC | PRN

## 2018-01-24 MED ORDER — BECLOMETHASONE DIPROP HFA 80 MCG/ACT IN AERB: 1.0000 | INHALATION_SPRAY | Freq: Two times a day (BID) | RESPIRATORY_TRACT | 5 refills | Status: DC

## 2018-01-24 NOTE — Progress Notes (Signed)
I,Jennifer Gorman,acting as a Neurosurgeon for Shade Flood, MD.,have documented all relevant documentation on the behalf of Shade Flood, MD,as directed by  Shade Flood, MD while in the presence of Shade Flood, MD.  Subjective:    Patient ID: Dylan Rodgers, male    DOB: 1951/05/25, 67 y.o.   MRN: [id number]  Chief Complaint  Patient presents with  . Asthma    Follow up, and discuss Prozac  . Sore Throat    Headache, Fatigue, Symptoms started 2/11 have since went away    HPI   Dylan Rodgers is a 67 y.o. male who presents to Primary Care at [facility] for a follow-up of his asthma macrh 2018.    Social Anxiey Disorder Dpoing well on 20 mg prozoec some sexual side affect but decidng to say on  Heis tolerating prozac well and would like to continue on this medication. Dylan Rodgers denies SI, HI, ETOH abuse   Depression screen [facility] 2/9 01/24/2018 02/22/2017 06/29/2016 12/27/2015 10/26/2015  Decreased Interest - 0 0 0 1  Down, Depressed, Hopeless 0 0 0 0 3  PHQ - 2 Score 0 0 0 0 4  Altered sleeping - - - - 3  Tired, decreased energy - - - - 2  Change in appetite - - - - 1  Feeling bad or failure about yourself  - - - - 1  Trouble concentrating - - - - 0  Moving slowly or fidgety/restless - - - - 0  Suicidal thoughts - - - - 0  PHQ-9 Score - - - - 11  Difficult doing work/chores - - - - Somewhat difficult    Asthama Changed from Qvar to flovant with albuterol as needed. Dylan Rodgers reports that Dylan Rodgers had a few refills left with Qvar and so Dylan Rodgers restarted taking that once a day. Dylan Rodgers feels that it works better. Dylan Rodgers has only needed to use albuterol once every few months. Staying that when Dylan Rodgers is on Qvar Dylan Rodgers doesn't need to use it, but when Dylan Rodgers is on Flovent Dylan Rodgers had a to use it a couple of times.   Sore throat, Headache, fatigue About three days ago Dylan Rodgers started feeling fatigued.  Associated symptoms include sore throat, HA, and chills. Dylan Rodgers reports "basically staying in bed". Dylan Rodgers was able to eat and drink  without any issues. Dylan Rodgers took Catering manager plus last night and this morning for relief. Pt denies fever. Feels significantly better today than past few days. No fever today.      Patient Active Problem List   Diagnosis Date Noted  . Moderate intermittent asthma without complication 06/30/2016  . Social anxiety disorder 06/30/2016   Past Medical History:  Diagnosis Date  . Asthma    No past surgical history on file. No Known Allergies Prior to Admission medications   Medication Sig Start Date End Date Taking? Authorizing Provider  albuterol (PROVENTIL HFA;VENTOLIN HFA) 108 (90 BASE) MCG/ACT inhaler Inhale 1-2 puffs into the lungs every 4 (four) hours as needed for wheezing or shortness of breath. 10/26/15  Yes Shade Flood, MD  calcium carbonate (OS-CAL) 600 MG TABS tablet Take 600 mg by mouth 2 (two) times daily with a meal.   Yes [provider]  cholecalciferol (VITAMIN D) 1000 UNITS tablet Take 1,000 Units by mouth daily.   Yes [provider]  FLUoxetine (PROZAC) 20 MG capsule TAKE ONE CAPSULE BY MOUTH EVERY DAY 01/11/18  Yes Shade Flood, MD  fluticasone [facility] HFA)  44 MCG/ACT inhaler Inhale 2 puffs into the lungs 2 (two) times daily. 02/14/17  Yes Shade Flood, MD  Multiple Vitamin (MULTIVITAMIN WITH MINERALS) TABS tablet Take 1 tablet by mouth daily.   Yes [provider]  vitamin B-12 (CYANOCOBALAMIN) 1000 MCG tablet Take 1,000 mcg by mouth daily.   Yes [provider]  vitamin C (ASCORBIC ACID) 500 MG tablet Take 500 mg by mouth daily.   Yes [provider]  zinc sulfate 220 MG capsule Take 220 mg by mouth daily.   Yes [provider]   Social History   Socioeconomic History  . Marital status: Married    Spouse name: Not on file  . Number of children: Not on file  . Years of education: Not on file  . Highest education level: Not on file  Social Needs  . Financial resource strain: Not on file  . Food  insecurity - worry: Not on file  . Food insecurity - inability: Not on file  . Transportation needs - medical: Not on file  . Transportation needs - non-medical: Not on file  Occupational History  . Not on file  Tobacco Use  . Smoking status: Former Smoker    Packs/day: 0.50    Types: Cigarettes    Last attempt to quit: 11/16/2013    Years since quitting: 4.1  . Smokeless tobacco: Never Used  Substance and Sexual Activity  . Alcohol use: No  . Drug use: Yes    Types: Marijuana  . Sexual activity: Not on file  Other Topics Concern  . Not on file  Social History Narrative  . Not on file    Review of Systems  Constitutional: Positive for chills and fatigue. Negative for fever.  HENT: Positive for sore throat.   Neurological: Positive for headaches.  Psychiatric/Behavioral: Negative for suicidal ideas. The patient is not nervous/anxious.        Objective:   Physical Exam  Constitutional: Dylan Rodgers is oriented to person, place, and time. Dylan Rodgers appears well-developed and well-nourished.  HENT:  Head: Normocephalic and atraumatic.  Right Ear: Tympanic membrane, external ear and ear canal normal.  Left Ear: Tympanic membrane, external ear and ear canal normal.  Nose: No rhinorrhea.  Mouth/Throat: Mucous membranes are normal. Posterior oropharyngeal erythema (minimal) present. No oropharyngeal exudate.  cerumen bilaterally  Eyes: Conjunctivae are normal. Pupils are equal, round, and reactive to light.  Neck: Neck supple.  Cardiovascular: Normal rate, regular rhythm, normal heart sounds and intact distal pulses.  No murmur heard. Pulmonary/Chest: Effort normal and breath sounds normal. Dylan Rodgers has no wheezes. Dylan Rodgers has no rhonchi. Dylan Rodgers has no rales.  Abdominal: Soft. There is no tenderness.  Lymphadenopathy:    Dylan Rodgers has no cervical adenopathy.  Neurological: Dylan Rodgers is alert and oriented to person, place, and time.  Skin: Skin is warm and dry. No rash noted.  Psychiatric: Dylan Rodgers has a normal mood and  affect. His behavior is normal.  Vitals reviewed.   Vitals:   01/24/18 1329  BP: 133/86  Pulse: 85  Resp: 18  Temp: 98.4 F (36.9 C)  TempSrc: Oral  SpO2: 96%  Weight: 181 lb (82.1 kg)  Height: 5\' 8"  (1.727 m)         Assessment & Plan:    DEMARQUEZ CIOLEK is a 67 y.o. male Acute upper respiratory infection  - Suspected viral illness, improving, symptomatic care discussed and contact precautions discussed if any persistent fever.  Social anxiety disorder - Plan: FLUoxetine (PROZAC) 20  MG capsule  - Stable, continue same dose of Prozac. Recheck in 6 months of the time of physical.  Mild intermittent asthma without complication - Plan: beclomethasone (QVAR REDIHALER) 80 MCG/ACT inhaler, albuterol (PROVENTIL HFA;VENTOLIN HFA) 108 (90 Base) MCG/ACT inhaler  - Controlled with Qvar. Refilled at same dose, ibuprofen for breakthrough symptoms.  Health maintenance. Recommended calling to schedule colonoscopy as presented recommended. Can discuss cost with his insurance carrier or possibly with the GI office to determine his portion. Recommended Prevnar, declined at this time. Plan on physical in 6 months.   Meds ordered this encounter  Medications  . FLUoxetine (PROZAC) 20 MG capsule    Sig: Take 1 capsule (20 mg total) by mouth daily.    Dispense:  90 capsule    Refill:  2  . beclomethasone (QVAR REDIHALER) 80 MCG/ACT inhaler    Sig: Inhale 1 puff into the lungs 2 (two) times daily.    Dispense:  10.6 g    Refill:  5  . albuterol (PROVENTIL HFA;VENTOLIN HFA) 108 (90 Base) MCG/ACT inhaler    Sig: Inhale 1-2 puffs into the lungs every 4 (four) hours as needed for wheezing or shortness of breath.    Dispense:  1 Inhaler    Refill:  0   Patient Instructions    Cold symptoms should continue to improve. Do not return to work until fever is gone for 24 hours (off fever reducers like tylenol).  Return to the clinic or go to the nearest emergency room if any of your symptoms  worsen or new symptoms occur.  IF you are having some specific joint pains please follow up to discuss those further.   Please schedule colonoscopy at your convenience. Can call your insurance to determine cost or the gastroenterologist office may be able to provide more info.   Thanks for coming in today.   Upper Respiratory Infection, Adult Most upper respiratory infections (URIs) are caused by a virus. A URI affects the nose, throat, and upper air passages. The most common type of URI is often called "the common cold." Follow these instructions at home:  Take medicines only as told by your doctor.  Gargle warm saltwater or take cough drops to comfort your throat as told by your doctor.  Use a warm mist humidifier or inhale steam from a shower to increase air moisture. This may make it easier to breathe.  Drink enough fluid to keep your pee (urine) clear or pale yellow.  Eat soups and other clear broths.  Have a healthy diet.  Rest as needed.  Go back to work when your fever is gone or your doctor says it is okay. ? You may need to stay home longer to avoid giving your URI to others. ? You can also wear a face mask and wash your hands often to prevent spread of the virus.  Use your inhaler more if you have asthma.  Do not use any tobacco products, including cigarettes, chewing tobacco, or electronic cigarettes. If you need help quitting, ask your doctor. Contact a doctor if:  You are getting worse, not better.  Your symptoms are not helped by medicine.  You have chills.  You are getting more short of breath.  You have brown or red mucus.  You have yellow or brown discharge from your nose.  You have pain in your face, especially when you bend forward.  You have a fever.  You have puffy (swollen) neck glands.  You have pain while swallowing.  You have white areas in the back of your throat. Get help right away if:  You have very bad or  constant: ? Headache. ? Ear pain. ? Pain in your forehead, behind your eyes, and over your cheekbones (sinus pain). ? Chest pain.  You have long-lasting (chronic) lung disease and any of the following: ? Wheezing. ? Long-lasting cough. ? Coughing up blood. ? A change in your usual mucus.  You have a stiff neck.  You have changes in your: ? Vision. ? Hearing. ? Thinking. ? Mood. This information is not intended to replace advice given to you by your health care provider. Make sure you discuss any questions you have with your health care provider. Document Released: 05/15/2008 Document Revised: 07/30/2016 Document Reviewed: 03/04/2014 Elsevier Interactive Patient Education  2018 [employer]    IF you received an x-ray today, you will receive an invoice from [facility]. Please contact [facility] at [phone] with questions or concerns regarding your invoice.   IF you received labwork today, you will receive an invoice from [facility]. Please contact [facility] at [phone] with questions or concerns regarding your invoice.   Our billing staff will not be able to assist you with questions regarding bills from these companies.  You will be contacted with the lab results as soon as they are available. The fastest way to get your results is to activate your My Chart account. Instructions are located on the last page of this paperwork. If you have not heard from us regarding the results in 2 weeks, please contact this office.      I personally performed the services described in this documentation, which was scribed in my presence. The recorded information has been reviewed and considered for accuracy and completeness, addended by me as needed, and agree with information above.  Signed,   Meredith StaggersJeffrey Taquila Leys, MD [facility] at [facility].  01/24/18 1:55 PM

## 2018-01-24 NOTE — Patient Instructions (Addendum)
Cold symptoms should continue to improve. Do not return to work until fever is gone for 24 hours (off fever reducers like tylenol).  Return to the clinic or go to the nearest emergency room if any of your symptoms worsen or new symptoms occur.  IF you are having some specific joint pains please follow up to discuss those further.   Please schedule colonoscopy at your convenience. Can call your insurance to determine cost or the gastroenterologist office may be able to provide more info.   Thanks for coming in today.   Upper Respiratory Infection, Adult Most upper respiratory infections (URIs) are caused by a virus. A URI affects the nose, throat, and upper air passages. The most common type of URI is often called "the common cold." Follow these instructions at home:  Take medicines only as told by your doctor.  Gargle warm saltwater or take cough drops to comfort your throat as told by your doctor.  Use a warm mist humidifier or inhale steam from a shower to increase air moisture. This may make it easier to breathe.  Drink enough fluid to keep your pee (urine) clear or pale yellow.  Eat soups and other clear broths.  Have a healthy diet.  Rest as needed.  Go back to work when your fever is gone or your doctor says it is okay. ? You may need to stay home longer to avoid giving your URI to others. ? You can also wear a face mask and wash your hands often to prevent spread of the virus.  Use your inhaler more if you have asthma.  Do not use any tobacco products, including cigarettes, chewing tobacco, or electronic cigarettes. If you need help quitting, ask your doctor. Contact a doctor if:  You are getting worse, not better.  Your symptoms are not helped by medicine.  You have chills.  You are getting more short of breath.  You have brown or red mucus.  You have yellow or brown discharge from your nose.  You have pain in your face, especially when you bend  forward.  You have a fever.  You have puffy (swollen) neck glands.  You have pain while swallowing.  You have white areas in the back of your throat. Get help right away if:  You have very bad or constant: ? Headache. ? Ear pain. ? Pain in your forehead, behind your eyes, and over your cheekbones (sinus pain). ? Chest pain.  You have long-lasting (chronic) lung disease and any of the following: ? Wheezing. ? Long-lasting cough. ? Coughing up blood. ? A change in your usual mucus.  You have a stiff neck.  You have changes in your: ? Vision. ? Hearing. ? Thinking. ? Mood. This information is not intended to replace advice given to you by your health care provider. Make sure you discuss any questions you have with your health care provider. Document Released: 05/15/2008 Document Revised: 07/30/2016 Document Reviewed: 03/04/2014 Elsevier Interactive Patient Education  2018 [employer]    IF you received an x-ray today, you will receive an invoice from [facility]. Please contact [facility] at [phone] with questions or concerns regarding your invoice.   IF you received labwork today, you will receive an invoice from [facility]. Please contact [facility] at [phone] with questions or concerns regarding your invoice.   Our billing staff will not be able to assist you with questions regarding bills from these companies.  You will be contacted with the lab results as  soon as they are available. The fastest way to get your results is to activate your My Chart account. Instructions are located on the last page of this paperwork. If you have not heard from [place] regarding the results in 2 weeks, please contact this office.

## 2018-03-05 ENCOUNTER — Other Ambulatory Visit: Payer: Self-pay

## 2018-03-05 ENCOUNTER — Ambulatory Visit (INDEPENDENT_AMBULATORY_CARE_PROVIDER_SITE_OTHER): Payer: Managed Care, Other (non HMO) | Admitting: Family Medicine

## 2018-03-05 ENCOUNTER — Encounter: Payer: Self-pay | Admitting: Family Medicine

## 2018-03-05 VITALS — BP 136/83 | HR 61 | Temp 98.1°F | Resp 18 | Ht 69.57 in | Wt 182.0 lb

## 2018-03-05 DIAGNOSIS — E785 Hyperlipidemia, unspecified: Secondary | ICD-10-CM

## 2018-03-05 DIAGNOSIS — Z23 Encounter for immunization: Secondary | ICD-10-CM

## 2018-03-05 DIAGNOSIS — Z125 Encounter for screening for malignant neoplasm of prostate: Secondary | ICD-10-CM | POA: Diagnosis not present

## 2018-03-05 DIAGNOSIS — Z131 Encounter for screening for diabetes mellitus: Secondary | ICD-10-CM | POA: Diagnosis not present

## 2018-03-05 DIAGNOSIS — Z0000 Encounter for general adult medical examination without abnormal findings: Secondary | ICD-10-CM

## 2018-03-05 MED ORDER — ZOSTER VAC RECOMB ADJUVANTED 50 MCG/0.5ML IM SUSR: 0.5000 mL | Freq: Once | INTRAMUSCULAR | 1 refills | Status: AC

## 2018-03-05 NOTE — Progress Notes (Signed)
Subjective:  By signing my name below, I, Dylan Rodgers, attest that this documentation has been prepared under the direction and in the presence of Dylan Agreste, MD Electronically Signed: Ladene Artist, ED Scribe 03/05/2018 at 12:38 PM.   Patient ID: Dylan Rodgers, male    DOB: 11/24/1951, 67 y.o.   MRN: [id number]  Chief Complaint  Patient presents with  . Annual Exam   HPI [name] is a 67 y.o. male who presents to Primary Care at [facility] for an annual exam.   Anxiety H/o social anxiety disorder, improved with Prozac. Tolerated Prozac. Option of adding Wellbutrin. Discussed previosuly. Pt decided to continue same dose Prozac at 20 mg qd. Pt was having some decreased libido in the past. Denies new side-effects.  Asthma Moderate intermittent. Treated with Qvar then Flovent with albuterol as needed. Overall he felt Qvar was working better when last discussed in Feb. Continued same dose Qvar. Last used albuterol 1-2 months ago.  L Hip Pain Pt reports gradually improving L hip pain x 3-4 months that he attributes to riding a bike for the first time in a while. He feels that L hip is 90% improved at this time.  CA Screening Colonoscopy: Pt has never had a colonoscopy. He was referred in 02/2017 to [place]. Plans to schedule Prostate CA Screening: 1.6 in 02/2017. Pt agrees to DRE and PSA today. No results found for: PSA  Hep C and HIV Screening: done last yr, non-reactive  Immunizations Immunization History  Administered Date(s) Administered  . Tdap 02/22/2017  Prevnar: declined at last visit; pt agrees to vaccine today Shingles: pt is unsure if he received this vaccine  Depression Screening Depression screen [facility] 2/9 03/05/2018 01/24/2018 02/22/2017 06/29/2016 12/27/2015  Decreased Interest 0 - 0 0 0  Down, Depressed, Hopeless 0 0 0 0 0  PHQ - 2 Score 0 0 0 0 0  Altered sleeping - - - - -  Tired, decreased energy - - - - -  Change in appetite - - - - -  Feeling bad  or failure about yourself  - - - - -  Trouble concentrating - - - - -  Moving slowly or fidgety/restless - - - - -  Suicidal thoughts - - - - -  PHQ-9 Score - - - - -  Difficult doing work/chores - - - - -    Visual Acuity Screening   Right eye Left eye Both eyes  Without correction: '20/40 20/40 20/40 '  With correction:      Vision: last seen a few yrs ago, plans to schedule an appointment Dentist: pt has dentures Exercise: doing home exercises due to L hip pain, ~150 mins/wk  Fall Screening No falls in the past yr.  Functional Status Survey:     Mental Status Screening No flowsheet data found.  Advanced Directives  No current living will or POA.  Hyperlipidemia Lab Results  Component Value Date   CHOL 226 (H) 02/22/2017   HDL 82 02/22/2017   LDLCALC 117 (H) 02/22/2017   TRIG 134 02/22/2017   CHOLHDL 2.8 02/22/2017   Lab Results  Component Value Date   ALT 31 02/22/2017   AST 23 02/22/2017   ALKPHOS 58 02/22/2017   BILITOT <0.2 02/22/2017  Recommended restaring exercise with rpt testing in 6 months. No family h/o high cholesterol. Wt Readings from Last 3 Encounters:  03/05/18 182 lb (82.6 kg)  01/24/18 181 lb (82.1 kg)  02/22/17 177 lb 12.8  oz (80.6 kg)   Patient Active Problem List   Diagnosis Date Noted  . Moderate intermittent asthma without complication 60/09/9322  . Social anxiety disorder 06/30/2016   Past Medical History:  Diagnosis Date  . Allergy   . Anxiety   . Asthma   . Depression    Past Surgical History:  Procedure Laterality Date  . APPENDECTOMY     No Known Allergies Prior to Admission medications   Medication Sig Start Date End Date Taking? Authorizing Provider  albuterol (PROVENTIL HFA;VENTOLIN HFA) 108 (90 Base) MCG/ACT inhaler Inhale 1-2 puffs into the lungs every 4 (four) hours as needed for wheezing or shortness of breath. 01/24/18   Dylan Agreste, MD  beclomethasone (QVAR REDIHALER) 80 MCG/ACT inhaler Inhale 1 puff into the  lungs 2 (two) times daily. 01/24/18   Dylan Agreste, MD  calcium carbonate (OS-CAL) 600 MG TABS tablet Take 600 mg by mouth 2 (two) times daily with a meal.    [provider]  cholecalciferol (VITAMIN D) 1000 UNITS tablet Take 1,000 Units by mouth daily.    [provider]  FLUoxetine (PROZAC) 20 MG capsule Take 1 capsule (20 mg total) by mouth daily. 01/24/18   Dylan Agreste, MD  Multiple Vitamin (MULTIVITAMIN WITH MINERALS) TABS tablet Take 1 tablet by mouth daily.    [provider]  vitamin B-12 (CYANOCOBALAMIN) 1000 MCG tablet Take 1,000 mcg by mouth daily.    [provider]  vitamin C (ASCORBIC ACID) 500 MG tablet Take 500 mg by mouth daily.    [provider]  zinc sulfate 220 MG capsule Take 220 mg by mouth daily.    [provider]   Social History   Socioeconomic History  . Marital status: Married    Spouse name: Not on file  . Number of children: 0  . Years of education: Not on file  . Highest education level: Not on file  Occupational History  . Not on file  Social Needs  . Financial resource strain: Not on file  . Food insecurity:    Worry: Not on file    Inability: Not on file  . Transportation needs:    Medical: Not on file    Non-medical: Not on file  Tobacco Use  . Smoking status: Former Smoker    Packs/day: 0.50    Types: Cigarettes    Last attempt to quit: 11/16/2013    Years since quitting: 4.3  . Smokeless tobacco: Never Used  Substance and Sexual Activity  . Alcohol use: Yes    Comment: once a year  . Drug use: Yes    Types: Marijuana  . Sexual activity: Yes  Lifestyle  . Physical activity:    Days per week: Not on file    Minutes per session: Not on file  . Stress: Not on file  Relationships  . Social connections:    Talks on phone: Not on file    Gets together: Not on file    Attends religious service: Not on file    Active member of club or organization: Not on file    Attends  meetings of clubs or organizations: Not on file    Relationship status: Not on file  . Intimate partner violence:    Fear of current or ex partner: Not on file    Emotionally abused: Not on file    Physically abused: Not on file    Forced sexual activity: Not on file  Other Topics  Concern  . Not on file  Social History Narrative  . Not on file   Review of Systems  Musculoskeletal: Positive for arthralgias.  All other systems reviewed and are negative.     Objective:   Physical Exam  Constitutional: He is oriented to person, place, and time. He appears well-developed and well-nourished.  HENT:  Head: Normocephalic and atraumatic.  Right Ear: External ear normal.  Left Ear: External ear normal.  Mouth/Throat: Oropharynx is clear and moist.  Moderate cerumen in bilateral canals, nonobstructive.  Eyes: Pupils are equal, round, and reactive to light. Conjunctivae and EOM are normal.  Neck: Normal range of motion. Neck supple. No thyromegaly present.  Cardiovascular: Normal rate, regular rhythm, normal heart sounds and intact distal pulses.  Pulmonary/Chest: Effort normal and breath sounds normal. No respiratory distress. He has no wheezes.  Abdominal: Soft. He exhibits no distension. There is no tenderness. Hernia confirmed negative in the right inguinal area and confirmed negative in the left inguinal area.  Genitourinary: Prostate normal.  Musculoskeletal: Normal range of motion. He exhibits no edema or tenderness.  Pain free internal/external rotation of L hip. Slight discomfort with terminal flexion of L hip.  Lymphadenopathy:    He has no cervical adenopathy.  Neurological: He is alert and oriented to person, place, and time. He has normal reflexes.  Skin: Skin is warm and dry.  Psychiatric: He has a normal mood and affect. His behavior is normal.  Vitals reviewed.  Vitals:   03/05/18 1217 03/05/18 1223  BP: (!) 146/86 136/83  Pulse: 61   Resp: 18   Temp: 98.1 F (36.7  C)   TempSrc: Oral   SpO2: 97%   Weight: 182 lb (82.6 kg)   Height: 5' 9.57" (1.767 m)       Assessment & Plan:    AKING KLABUNDE is a 67 y.o. male Annual physical exam  - -anticipatory guidance as below in AVS, screening labs above. Health maintenance items as above in HPI discussed/recommended as applicable.   Need for shingles vaccine - Plan: Zoster Vaccine Adjuvanted [facility]) injection sent to pharmacy  Screening for malignant neoplasm of prostate, Screening for prostate cancer - Plan: PSA  - We discussed pros and cons of prostate cancer screening, and after this discussion, he chose to have screening done. PSA obtained, and no concerning findings on DRE.   Need for prophylactic vaccination against Streptococcus pneumoniae (pneumococcus) - Plan: Pneumococcal conjugate vaccine 13-valent IM  -Prevnar given  Hyperlipidemia, unspecified hyperlipidemia type - Plan: Comprehensive metabolic panel, Lipid panel  -Check labs to determine statin need  Screening for diabetes mellitus - Plan: Comprehensive metabolic panel  Continue inhaler for asthma, appears overall controlled at this time.  Social anxiety also appears to be stable, no change in meds at this time.  Tylenol as needed with low intensity exercise for left hip pain is improving, but if that does not continue to improve or any persistent pain, follow-up to discuss further as well as possible x-rays   Meds ordered this encounter  Medications  . Zoster Vaccine Adjuvanted [facility]) injection    Sig: Inject 0.5 mLs into the muscle once for 1 dose. Repeat in 2-6 months.    Dispense:  0.5 mL    Refill:  1   Patient Instructions    For your left hip pain, ok to try tylenol over the counter if needed. Low intensity exercise ok for now if that is not causing pain. Slowly returning to biking is ok  if pain resolves, but if any persistent pain in the next few weeks, or worse pain please return for recheck. Can schedule follow  up in 1 month at this point.   For asthma - continue Qvar twice per day. Albuterol if needed for breakthrough wheezing.   For social anxiety, continue prozac once per day. If any libido issues, please return to discuss other options as addition of Wellbutrin may be an option.   I will check your cholesterol again to decide if any medication is needed.   Call [facility] to schedule colonoscopy. [phone]  Call eye care provider for appointment.   prevnar given today, plan on pneumovax vaccine in 1 year.   Preventive Care 16 Years and Older, Male Preventive care refers to lifestyle choices and visits with your health care provider that can promote health and wellness. What does preventive care include?  A yearly physical exam. This is also called an annual well check.  Dental exams once or twice a year.  Routine eye exams. Ask your health care provider how often you should have your eyes checked.  Personal lifestyle choices, including: ? Daily care of your teeth and gums. ? Regular physical activity. ? Eating a healthy diet. ? Avoiding tobacco and drug use. ? Limiting alcohol use. ? Practicing safe sex. ? Taking low doses of aspirin every day. ? Taking vitamin and mineral supplements as recommended by your health care provider. What happens during an annual well check? The services and screenings done by your health care provider during your annual well check will depend on your age, overall health, lifestyle risk factors, and family history of disease. Counseling Your health care provider may ask you questions about your:  Alcohol use.  Tobacco use.  Drug use.  Emotional well-being.  Home and relationship well-being.  Sexual activity.  Eating habits.  History of falls.  Memory and ability to understand (cognition).  Work and work Statistician.  Screening You may have the following tests or measurements:  Height, weight, and BMI.  Blood  pressure.  Lipid and cholesterol levels. These may be checked every 5 years, or more frequently if you are over 100 years old.  Skin check.  Lung cancer screening. You may have this screening every year starting at age 18 if you have a 30-pack-year history of smoking and currently smoke or have quit within the past 15 years.  Fecal occult blood test (FOBT) of the stool. You may have this test every year starting at age 60.  Flexible sigmoidoscopy or colonoscopy. You may have a sigmoidoscopy every 5 years or a colonoscopy every 10 years starting at age 1.  Prostate cancer screening. Recommendations will vary depending on your family history and other risks.  Hepatitis C blood test.  Hepatitis B blood test.  Sexually transmitted disease (STD) testing.  Diabetes screening. This is done by checking your blood sugar (glucose) after you have not eaten for a while (fasting). You may have this done every 1-3 years.  Abdominal aortic aneurysm (AAA) screening. You may need this if you are a current or former smoker.  Osteoporosis. You may be screened starting at age 68 if you are at high risk.  Talk with your health care provider about your test results, treatment options, and if necessary, the need for more tests. Vaccines Your health care provider may recommend certain vaccines, such as:  Influenza vaccine. This is recommended every year.  Tetanus, diphtheria, and acellular pertussis (Tdap, Td) vaccine. You may need  a Td booster every 10 years.  Varicella vaccine. You may need this if you have not been vaccinated.  Zoster vaccine. You may need this after age 38.  Measles, mumps, and rubella (MMR) vaccine. You may need at least one dose of MMR if you were born in 1957 or later. You may also need a second dose.  Pneumococcal 13-valent conjugate (PCV13) vaccine. One dose is recommended after age 46.  Pneumococcal polysaccharide (PPSV23) vaccine. One dose is recommended after age  17.  Meningococcal vaccine. You may need this if you have certain conditions.  Hepatitis A vaccine. You may need this if you have certain conditions or if you travel or work in places where you may be exposed to hepatitis A.  Hepatitis B vaccine. You may need this if you have certain conditions or if you travel or work in places where you may be exposed to hepatitis B.  Haemophilus influenzae type b (Hib) vaccine. You may need this if you have certain risk factors.  Talk to your health care provider about which screenings and vaccines you need and how often you need them. This information is not intended to replace advice given to you by your health care provider. Make sure you discuss any questions you have with your health care provider. Document Released: 12/24/2015 Document Revised: 08/16/2016 Document Reviewed: 09/28/2015 Elsevier Interactive Patient Education  2018 [employer].   IF you received an x-ray today, you will receive an invoice from [facility]. Please contact [facility] at [phone] with questions or concerns regarding your invoice.   IF you received labwork today, you will receive an invoice from [place]. Please contact [facility] at [phone] with questions or concerns regarding your invoice.   Our billing staff will not be able to assist you with questions regarding bills from these companies.  You will be contacted with the lab results as soon as they are available. The fastest way to get your results is to activate your My Chart account. Instructions are located on the last page of this paperwork. If you have not heard from [place] regarding the results in 2 weeks, please contact this office.      I personally performed the services described in this documentation, which was scribed in my presence. The recorded information has been reviewed and considered for accuracy and completeness, addended by me as needed, and agree with information  above.  Signed,   Merri Ray, MD [facility] at [place].  03/08/18 9:05 AM

## 2018-03-05 NOTE — Patient Instructions (Addendum)
For your left hip pain, ok to try tylenol over the counter if needed. Low intensity exercise ok for now if that is not causing pain. Slowly returning to biking is ok if pain resolves, but if any persistent pain in the next few weeks, or worse pain please return for recheck. Can schedule follow up in 1 month at this point.   For asthma - continue Qvar twice per day. Albuterol if needed for breakthrough wheezing.   For social anxiety, continue prozac once per day. If any libido issues, please return to discuss other options as addition of Wellbutrin may be an option.   I will check your cholesterol again to decide if any medication is needed.   Call [facility] to schedule colonoscopy. [phone]  Call eye care provider for appointment.   prevnar given today, plan on pneumovax vaccine in 1 year.   Preventive Care 84 Years and Older, Male Preventive care refers to lifestyle choices and visits with your health care provider that can promote health and wellness. What does preventive care include?  A yearly physical exam. This is also called an annual well check.  Dental exams once or twice a year.  Routine eye exams. Ask your health care provider how often you should have your eyes checked.  Personal lifestyle choices, including: ? Daily care of your teeth and gums. ? Regular physical activity. ? Eating a healthy diet. ? Avoiding tobacco and drug use. ? Limiting alcohol use. ? Practicing safe sex. ? Taking low doses of aspirin every day. ? Taking vitamin and mineral supplements as recommended by your health care provider. What happens during an annual well check? The services and screenings done by your health care provider during your annual well check will depend on your age, overall health, lifestyle risk factors, and family history of disease. Counseling Your health care provider may ask you questions about your:  Alcohol use.  Tobacco use.  Drug  use.  Emotional well-being.  Home and relationship well-being.  Sexual activity.  Eating habits.  History of falls.  Memory and ability to understand (cognition).  Work and work Statistician.  Screening You may have the following tests or measurements:  Height, weight, and BMI.  Blood pressure.  Lipid and cholesterol levels. These may be checked every 5 years, or more frequently if you are over 77 years old.  Skin check.  Lung cancer screening. You may have this screening every year starting at age 39 if you have a 30-pack-year history of smoking and currently smoke or have quit within the past 15 years.  Fecal occult blood test (FOBT) of the stool. You may have this test every year starting at age 54.  Flexible sigmoidoscopy or colonoscopy. You may have a sigmoidoscopy every 5 years or a colonoscopy every 10 years starting at age 12.  Prostate cancer screening. Recommendations will vary depending on your family history and other risks.  Hepatitis C blood test.  Hepatitis B blood test.  Sexually transmitted disease (STD) testing.  Diabetes screening. This is done by checking your blood sugar (glucose) after you have not eaten for a while (fasting). You may have this done every 1-3 years.  Abdominal aortic aneurysm (AAA) screening. You may need this if you are a current or former smoker.  Osteoporosis. You may be screened starting at age 45 if you are at high risk.  Talk with your health care provider about your test results, treatment options, and if necessary, the need for more tests.  Vaccines Your health care provider may recommend certain vaccines, such as:  Influenza vaccine. This is recommended every year.  Tetanus, diphtheria, and acellular pertussis (Tdap, Td) vaccine. You may need a Td booster every 10 years.  Varicella vaccine. You may need this if you have not been vaccinated.  Zoster vaccine. You may need this after age 42.  Measles, mumps, and  rubella (MMR) vaccine. You may need at least one dose of MMR if you were born in 1957 or later. You may also need a second dose.  Pneumococcal 13-valent conjugate (PCV13) vaccine. One dose is recommended after age 110.  Pneumococcal polysaccharide (PPSV23) vaccine. One dose is recommended after age 76.  Meningococcal vaccine. You may need this if you have certain conditions.  Hepatitis A vaccine. You may need this if you have certain conditions or if you travel or work in places where you may be exposed to hepatitis A.  Hepatitis B vaccine. You may need this if you have certain conditions or if you travel or work in places where you may be exposed to hepatitis B.  Haemophilus influenzae type b (Hib) vaccine. You may need this if you have certain risk factors.  Talk to your health care provider about which screenings and vaccines you need and how often you need them. This information is not intended to replace advice given to you by your health care provider. Make sure you discuss any questions you have with your health care provider. Document Released: 12/24/2015 Document Revised: 08/16/2016 Document Reviewed: 09/28/2015 Elsevier Interactive Patient Education  2018 [employer].   IF you received an x-ray today, you will receive an invoice from [facility]. Please contact [facility] at [phone] with questions or concerns regarding your invoice.   IF you received labwork today, you will receive an invoice from [place]. Please contact [facility] at [phone] with questions or concerns regarding your invoice.   Our billing staff will not be able to assist you with questions regarding bills from these companies.  You will be contacted with the lab results as soon as they are available. The fastest way to get your results is to activate your My Chart account. Instructions are located on the last page of this paperwork. If you have not heard from [place] regarding the results  in 2 weeks, please contact this office.

## 2018-03-06 LAB — LIPID PANEL
Chol/HDL Ratio: 3.3 ratio (ref 0.0–5.0)
Cholesterol, Total: 262 mg/dL — ABNORMAL HIGH (ref 100–199)
HDL: 79 mg/dL (ref >39)
LDL CALC: 147 mg/dL — AB (ref 0–99)
Triglycerides: 178 mg/dL — ABNORMAL HIGH (ref 0–149)
VLDL CHOLESTEROL CAL: 36 mg/dL (ref 5–40)

## 2018-03-06 LAB — COMPREHENSIVE METABOLIC PANEL
ALBUMIN: 4.6 g/dL (ref 3.6–4.8)
ALT: 30 IU/L (ref 0–44)
AST: 22 IU/L (ref 0–40)
Albumin/Globulin Ratio: 1.6 (ref 1.2–2.2)
Alkaline Phosphatase: 67 IU/L (ref 39–117)
BILIRUBIN TOTAL: 0.2 mg/dL (ref 0.0–1.2)
BUN/Creatinine Ratio: 17 (ref 10–24)
BUN: 14 mg/dL (ref 8–27)
CALCIUM: 9.7 mg/dL (ref 8.6–10.2)
CHLORIDE: 101 mmol/L (ref 96–106)
CO2: 25 mmol/L (ref 20–29)
CREATININE: 0.82 mg/dL (ref 0.76–1.27)
GFR calc Af Amer: 107 mL/min/{1.73_m2} (ref >59)
GFR, EST NON AFRICAN AMERICAN: 93 mL/min/{1.73_m2} (ref >59)
GLUCOSE: 93 mg/dL (ref 65–99)
Globulin, Total: 2.8 g/dL (ref 1.5–4.5)
Potassium: 4.7 mmol/L (ref 3.5–5.2)
Sodium: 140 mmol/L (ref 134–144)
TOTAL PROTEIN: 7.4 g/dL (ref 6.0–8.5)

## 2018-03-06 LAB — PSA: PROSTATE SPECIFIC AG, SERUM: 2.6 ng/mL (ref 0.0–4.0)

## 2018-03-13 ENCOUNTER — Telehealth: Payer: Self-pay | Admitting: *Deleted

## 2018-03-13 NOTE — Telephone Encounter (Signed)
Put paperwork out front copied and and put in scan box.  Left patient a message that form is at [address] to be picked up.

## 2018-05-07 ENCOUNTER — Ambulatory Visit (INDEPENDENT_AMBULATORY_CARE_PROVIDER_SITE_OTHER): Payer: Managed Care, Other (non HMO) | Admitting: Family Medicine

## 2018-05-07 ENCOUNTER — Encounter: Payer: Self-pay | Admitting: Family Medicine

## 2018-05-07 VITALS — BP 124/80 | HR 74 | Temp 98.9°F | Resp 17 | Ht 69.5 in | Wt 184.0 lb

## 2018-05-07 DIAGNOSIS — H6123 Impacted cerumen, bilateral: Secondary | ICD-10-CM | POA: Diagnosis not present

## 2018-05-07 NOTE — Progress Notes (Signed)
Subjective:  By signing my name below, I, [name], attest that this documentation has been prepared under the direction and in the presence of [name], MD. Electronically Signed: [name], Scribe. 05/07/2018 , 3:10 PM .  Patient was seen in Room 9 .   Patient ID: Dylan Rodgers, male    DOB: 03-Feb-1951, 67 y.o.   MRN: [id number] Chief Complaint  Patient presents with  . Cerumen Impaction    wax in both ears right ear more than left.    HPI Dylan Rodgers is a 67 y.o. male  Patient reports bilateral ears with cerumen impaction. He hasn't been able to hear out of his right ear, started about a week ago. He has been able to hear out of his left ear. He denies trying any OTC treatments.   Patient Active Problem List   Diagnosis Date Noted  . Moderate intermittent asthma without complication 06/30/2016  . Social anxiety disorder 06/30/2016   Past Medical History:  Diagnosis Date  . Allergy   . Anxiety   . Asthma   . Depression    Past Surgical History:  Procedure Laterality Date  . APPENDECTOMY     No Known Allergies Prior to Admission medications   Medication Sig Start Date End Date Taking? Authorizing Provider  albuterol (PROVENTIL HFA;VENTOLIN HFA) 108 (90 Base) MCG/ACT inhaler Inhale 1-2 puffs into the lungs every 4 (four) hours as needed for wheezing or shortness of breath. 01/24/18  Yes Shade Flood, MD  beclomethasone (QVAR REDIHALER) 80 MCG/ACT inhaler Inhale 1 puff into the lungs 2 (two) times daily. 01/24/18  Yes Shade Flood, MD  calcium carbonate (OS-CAL) 600 MG TABS tablet Take 600 mg by mouth 2 (two) times daily with a meal.   Yes [provider]  cholecalciferol (VITAMIN D) 1000 UNITS tablet Take 1,000 Units by mouth daily.   Yes [provider]  FLUoxetine (PROZAC) 20 MG capsule Take 1 capsule (20 mg total) by mouth daily. 01/24/18  Yes Shade Flood, MD  Glucosamine-Chondroitin (OSTEO BI-FLEX REGULAR STRENGTH PO)  Take by mouth 2 (two) times daily.   Yes [provider]  Multiple Vitamin (MULTIVITAMIN WITH MINERALS) TABS tablet Take 1 tablet by mouth daily.   Yes [provider]  vitamin B-12 (CYANOCOBALAMIN) 1000 MCG tablet Take 1,000 mcg by mouth daily.   Yes [provider]  vitamin C (ASCORBIC ACID) 500 MG tablet Take 500 mg by mouth daily.   Yes [provider]  zinc sulfate 220 MG capsule Take 220 mg by mouth daily.   Yes [provider]   Social History   Socioeconomic History  . Marital status: Married    Spouse name: Not on file  . Number of children: 0  . Years of education: Not on file  . Highest education level: Not on file  Occupational History  . Not on file  Social Needs  . Financial resource strain: Not on file  . Food insecurity:    Worry: Not on file    Inability: Not on file  . Transportation needs:    Medical: Not on file    Non-medical: Not on file  Tobacco Use  . Smoking status: Former Smoker    Packs/day: 0.50    Types: Cigarettes    Last attempt to quit: 11/16/2013    Years since quitting: 4.4  . Smokeless tobacco: Never Used  Substance and Sexual Activity  . Alcohol use: Yes    Comment:  once a year  . Drug use: Yes    Types: Marijuana  . Sexual activity: Yes  Lifestyle  . Physical activity:    Days per week: Not on file    Minutes per session: Not on file  . Stress: Not on file  Relationships  . Social connections:    Talks on phone: Not on file    Gets together: Not on file    Attends religious service: Not on file    Active member of club or organization: Not on file    Attends meetings of clubs or organizations: Not on file    Relationship status: Not on file  . Intimate partner violence:    Fear of current or ex partner: Not on file    Emotionally abused: Not on file    Physically abused: Not on file    Forced sexual activity: Not on file  Other Topics Concern  . Not on file  Social History  Narrative  . Not on file   Review of Systems  Constitutional: Negative for fatigue and unexpected weight change.  HENT: Positive for hearing loss (right ear).        Bilateral cerumen impaction  Eyes: Negative for visual disturbance.  Respiratory: Negative for cough, chest tightness and shortness of breath.   Cardiovascular: Negative for chest pain, palpitations and leg swelling.  Gastrointestinal: Negative for abdominal pain and blood in stool.  Neurological: Negative for dizziness, light-headedness and headaches.   [3:52 PM] After lavage bilaterally, clearing on right, minimal cerumen remain on the left. Patient states he's feeling much better, no complications. TM's intact.      Objective:   Physical Exam  Constitutional: He is oriented to person, place, and time. He appears well-developed and well-nourished. No distress.  HENT:  Head: Normocephalic and atraumatic.  Left ear: moderate cerumen, yellow brown, unable to visualize TM Right ear: obstructed by yellow to brown cerumen, slightly indented, unable to visualize TM  Eyes: Pupils are equal, round, and reactive to light. EOM are normal.  Neck: Neck supple.  Cardiovascular: Normal rate.  Pulmonary/Chest: Effort normal. No respiratory distress.  Musculoskeletal: Normal range of motion.  Neurological: He is alert and oriented to person, place, and time.  Skin: Skin is warm and dry.  Psychiatric: He has a normal mood and affect. His behavior is normal.  Nursing note and vitals reviewed.   Vitals:   05/07/18 1444  BP: 124/80  Pulse: 74  Resp: 17  Temp: 98.9 F (37.2 C)  TempSrc: Oral  SpO2: 98%  Weight: 184 lb (83.5 kg)  Height: 5' 9.5" (1.765 m)       Assessment & Plan:   [name] is a 67 y.o. male Bilateral impacted cerumen  -Lavage performed bilaterally with removal of impaction on right, significantly improved/removed cerumen on left with resolution of symptoms.  No complications.  Self-management  discussed for future impaction and handout given.  Previously reported hip pain has improved, advised to return to discuss further if recurrence of pain or limiting activities as may need imaging/further evaluation.  No orders of the defined types were placed in this encounter.  Patient Instructions    See information on cerumen impaction below. Over the counter Debrox is an option in the future if needed.   Follow up if hip issues return.    Thanks for coming in today.    Earwax Buildup, Adult The ears produce a substance called earwax that helps keep bacteria out of the ear  and protects the skin in the ear canal. Occasionally, earwax can build up in the ear and cause discomfort or hearing loss. What increases the risk? This condition is more likely to develop in people who:  Are male.  Are elderly.  Naturally produce more earwax.  Clean their ears often with cotton swabs.  Use earplugs often.  Use in-ear headphones often.  Wear hearing aids.  Have narrow ear canals.  Have earwax that is overly thick or sticky.  Have eczema.  Are dehydrated.  Have excess hair in the ear canal.  What are the signs or symptoms? Symptoms of this condition include:  Reduced or muffled hearing.  A feeling of fullness in the ear or feeling that the ear is plugged.  Fluid coming from the ear.  Ear pain.  Ear itch.  Ringing in the ear.  Coughing.  An obvious piece of earwax that can be seen inside the ear canal.  How is this diagnosed? This condition may be diagnosed based on:  Your symptoms.  Your medical history.  An ear exam. During the exam, your health care provider will look into your ear with an instrument called an otoscope.  You may have tests, including a hearing test. How is this treated? This condition may be treated by:  Using ear drops to soften the earwax.  Having the earwax removed by a health care provider. The health care provider may: ? Flush  the ear with water. ? Use an instrument that has a loop on the end (curette). ? Use a suction device.  Surgery to remove the wax buildup. This may be done in severe cases.  Follow these instructions at home:  Take over-the-counter and prescription medicines only as told by your health care provider.  Do not put any objects, including cotton swabs, into your ear. You can clean the opening of your ear canal with a washcloth or facial tissue.  Follow instructions from your health care provider about cleaning your ears. Do not over-clean your ears.  Drink enough fluid to keep your urine clear or pale yellow. This will help to thin the earwax.  Keep all follow-up visits as told by your health care provider. If earwax builds up in your ears often or if you use hearing aids, consider seeing your health care provider for routine, preventive ear cleanings. Ask your health care provider how often you should schedule your cleanings.  If you have hearing aids, clean them according to instructions from the manufacturer and your health care provider. Contact a health care provider if:  You have ear pain.  You develop a fever.  You have blood, pus, or other fluid coming from your ear.  You have hearing loss.  You have ringing in your ears that does not go away.  Your symptoms do not improve with treatment.  You feel like the room is spinning (vertigo). Summary  Earwax can build up in the ear and cause discomfort or hearing loss.  The most common symptoms of this condition include reduced or muffled hearing and a feeling of fullness in the ear or feeling that the ear is plugged.  This condition may be diagnosed based on your symptoms, your medical history, and an ear exam.  This condition may be treated by using ear drops to soften the earwax or by having the earwax removed by a health care provider.  Do not put any objects, including cotton swabs, into your ear. You can clean the opening  of your  ear canal with a washcloth or facial tissue. This information is not intended to replace advice given to you by your health care provider. Make sure you discuss any questions you have with your health care provider. Document Released: 01/04/2005 Document Revised: 02/07/2017 Document Reviewed: 02/07/2017 Elsevier Interactive Patient Education  2018 [employer].    IF you received an x-ray today, you will receive an invoice from [facility]. Please contact [facility] at [phone] with questions or concerns regarding your invoice.   IF you received labwork today, you will receive an invoice from [facility]. Please contact [facility] at [phone] with questions or concerns regarding your invoice.   Our billing staff will not be able to assist you with questions regarding bills from these companies.  You will be contacted with the lab results as soon as they are available. The fastest way to get your results is to activate your My Chart account. Instructions are located on the last page of this paperwork. If you have not heard from [place] regarding the results in 2 weeks, please contact this office.       I personally performed the services described in this documentation, which was scribed in my presence. The recorded information has been reviewed and considered for accuracy and completeness, addended by me as needed, and agree with information above.  Signed,   [name], MD [facility].  05/07/18 6:28 PM

## 2018-05-07 NOTE — Patient Instructions (Addendum)
See information on cerumen impaction below. Over the counter Debrox is an option in the future if needed.   Follow up if hip issues return.    Thanks for coming in today.    Earwax Buildup, Adult The ears produce a substance called earwax that helps keep bacteria out of the ear and protects the skin in the ear canal. Occasionally, earwax can build up in the ear and cause discomfort or hearing loss. What increases the risk? This condition is more likely to develop in people who:  Are male.  Are elderly.  Naturally produce more earwax.  Clean their ears often with cotton swabs.  Use earplugs often.  Use in-ear headphones often.  Wear hearing aids.  Have narrow ear canals.  Have earwax that is overly thick or sticky.  Have eczema.  Are dehydrated.  Have excess hair in the ear canal.  What are the signs or symptoms? Symptoms of this condition include:  Reduced or muffled hearing.  A feeling of fullness in the ear or feeling that the ear is plugged.  Fluid coming from the ear.  Ear pain.  Ear itch.  Ringing in the ear.  Coughing.  An obvious piece of earwax that can be seen inside the ear canal.  How is this diagnosed? This condition may be diagnosed based on:  Your symptoms.  Your medical history.  An ear exam. During the exam, your health care provider will look into your ear with an instrument called an otoscope.  You may have tests, including a hearing test. How is this treated? This condition may be treated by:  Using ear drops to soften the earwax.  Having the earwax removed by a health care provider. The health care provider may: ? Flush the ear with water. ? Use an instrument that has a loop on the end (curette). ? Use a suction device.  Surgery to remove the wax buildup. This may be done in severe cases.  Follow these instructions at home:  Take over-the-counter and prescription medicines only as told by your health care  provider.  Do not put any objects, including cotton swabs, into your ear. You can clean the opening of your ear canal with a washcloth or facial tissue.  Follow instructions from your health care provider about cleaning your ears. Do not over-clean your ears.  Drink enough fluid to keep your urine clear or pale yellow. This will help to thin the earwax.  Keep all follow-up visits as told by your health care provider. If earwax builds up in your ears often or if you use hearing aids, consider seeing your health care provider for routine, preventive ear cleanings. Ask your health care provider how often you should schedule your cleanings.  If you have hearing aids, clean them according to instructions from the manufacturer and your health care provider. Contact a health care provider if:  You have ear pain.  You develop a fever.  You have blood, pus, or other fluid coming from your ear.  You have hearing loss.  You have ringing in your ears that does not go away.  Your symptoms do not improve with treatment.  You feel like the room is spinning (vertigo). Summary  Earwax can build up in the ear and cause discomfort or hearing loss.  The most common symptoms of this condition include reduced or muffled hearing and a feeling of fullness in the ear or feeling that the ear is plugged.  This condition may be diagnosed based  on your symptoms, your medical history, and an ear exam.  This condition may be treated by using ear drops to soften the earwax or by having the earwax removed by a health care provider.  Do not put any objects, including cotton swabs, into your ear. You can clean the opening of your ear canal with a washcloth or facial tissue. This information is not intended to replace advice given to you by your health care provider. Make sure you discuss any questions you have with your health care provider. Document Released: 01/04/2005 Document Revised: 02/07/2017 Document  Reviewed: 02/07/2017 Elsevier Interactive Patient Education  2018 [employer].    IF you received an x-ray today, you will receive an invoice from [facility]. Please contact [facility] at [phone] with questions or concerns regarding your invoice.   IF you received labwork today, you will receive an invoice from [facility]. Please contact [facility] at [phone] with questions or concerns regarding your invoice.   Our billing staff will not be able to assist you with questions regarding bills from these companies.  You will be contacted with the lab results as soon as they are available. The fastest way to get your results is to activate your My Chart account. Instructions are located on the last page of this paperwork. If you have not heard from [place] regarding the results in 2 weeks, please contact this office.

## 2018-05-22 ENCOUNTER — Other Ambulatory Visit: Payer: Self-pay

## 2018-05-22 DIAGNOSIS — J4520 Mild intermittent asthma, uncomplicated: Secondary | ICD-10-CM

## 2018-05-22 MED ORDER — ALBUTEROL SULFATE HFA 108 (90 BASE) MCG/ACT IN AERS: 1.0000 | INHALATION_SPRAY | RESPIRATORY_TRACT | 0 refills | Status: DC | PRN

## 2018-05-22 MED ORDER — ALBUTEROL SULFATE HFA 108 (90 BASE) MCG/ACT IN AERS: 1.0000 | INHALATION_SPRAY | RESPIRATORY_TRACT | 0 refills | Status: AC | PRN

## 2018-05-22 NOTE — Addendum Note (Signed)
Addended by: Lisbeth RenshawHAN VELASCO, Enda Santo HUA on: 05/22/2018 01:55 PM   Modules accepted: Orders

## 2018-12-19 ENCOUNTER — Other Ambulatory Visit: Payer: Self-pay | Admitting: Family Medicine

## 2018-12-19 DIAGNOSIS — F4010 Social phobia, unspecified: Secondary | ICD-10-CM

## 2018-12-19 NOTE — Telephone Encounter (Signed)
Requested medication (s) are due for refill today -yes  Requested medication (s) are on the active medication list -yes  Future visit scheduled -no  Last refill: Rx written 01/24/18  Notes to clinic: Patient is requesting refill of medication that requires 6 month f/u. Patient was given original Rx for longer than that. Call to patient and left message to please schedule medication follow up appointment. Sent for review of refill request- courtesy refill.  Requested Prescriptions  Pending Prescriptions Disp Refills   FLUoxetine (PROZAC) 20 MG capsule [Pharmacy Med Name: FLUOXETINE HCL 20 MG CAPSULE] 90 capsule 2    Sig: TAKE 1 CAPSULE BY MOUTH EVERY DAY     Psychiatry:  Antidepressants - SSRI Failed - 12/19/2018  2:27 AM      Failed - Valid encounter within last 6 months    Recent Outpatient Visits          7 months ago Bilateral impacted cerumen   [facility], [facility] R, [facility]   9 months ago Annual physical exam   [facility], [facility] R, [facility]   10 months ago Acute upper respiratory infection   [facility], Asencion PartridgeJeff[facility] R, MD   1 year ago Annual physical exam   [facility], Asencion PartridgeJeff[facility] R, MD   2 years ago Asthma, moderate persistent, uncomplicated   [facility], Asencion PartridgeJeffrey R, MD              Requested Prescriptions  Pending Prescriptions Disp Refills   FLUoxetine (PROZAC) 20 MG capsule [Pharmacy Med Name: FLUOXETINE HCL 20 MG CAPSULE] 90 capsule 2    Sig: TAKE 1 CAPSULE BY MOUTH EVERY DAY     Psychiatry:  Antidepressants - SSRI Failed - 12/19/2018  2:27 AM      Failed - Valid encounter within last 6 months    Recent Outpatient Visits          7 months ago Bilateral impacted cerumen   [facility], Asencion PartridgeJeffrey R, MD   9 months ago Annual physical exam   [facility], Asencion PartridgeJeffrey R, MD   10 months ago Acute upper respiratory infection   [facility],  Asencion PartridgeJeffrey R, MD   1 year ago Annual physical exam   [facility], Asencion PartridgeJeffrey R, MD   2 years ago Asthma, moderate persistent, uncomplicated   [facility], Asencion PartridgeJeffrey R, MD

## 2018-12-26 NOTE — Telephone Encounter (Signed)
90 day supply given - please schedule appt.

## 2018-12-26 NOTE — Telephone Encounter (Signed)
Please advise 

## 2019-01-07 ENCOUNTER — Other Ambulatory Visit: Payer: Self-pay

## 2019-01-07 ENCOUNTER — Encounter: Payer: Self-pay | Admitting: Family Medicine

## 2019-01-07 ENCOUNTER — Ambulatory Visit (INDEPENDENT_AMBULATORY_CARE_PROVIDER_SITE_OTHER): Payer: Self-pay | Admitting: Family Medicine

## 2019-01-07 VITALS — BP 140/78 | HR 63 | Temp 98.0°F | Resp 16 | Ht 69.49 in | Wt 177.0 lb

## 2019-01-07 DIAGNOSIS — Z5321 Procedure and treatment not carried out due to patient leaving prior to being seen by health care provider: Secondary | ICD-10-CM

## 2019-01-07 NOTE — Progress Notes (Signed)
System was down Patient left wo being seen

## 2019-01-07 NOTE — Patient Instructions (Signed)
° ° ° °  If you have lab work done today you will be contacted with your lab results within the next 2 weeks.  If you have not heard from us then please contact us. The fastest way to get your results is to register for My Chart. ° ° °IF you received an x-ray today, you will receive an invoice from [facility]. Please contact  Radiology at [phone] with questions or concerns regarding your invoice.  ° °IF you received labwork today, you will receive an invoice from [facility]. Please contact [facility] at [phone] with questions or concerns regarding your invoice.  ° °Our billing staff will not be able to assist you with questions regarding bills from these companies. ° °You will be contacted with the lab results as soon as they are available. The fastest way to get your results is to activate your My Chart account. Instructions are located on the last page of this paperwork. If you have not heard from us regarding the results in 2 weeks, please contact this office. °  ° ° ° °

## 2019-01-08 ENCOUNTER — Encounter: Payer: Self-pay | Admitting: Emergency Medicine

## 2019-01-08 ENCOUNTER — Ambulatory Visit (INDEPENDENT_AMBULATORY_CARE_PROVIDER_SITE_OTHER): Payer: 59 | Admitting: Emergency Medicine

## 2019-01-08 ENCOUNTER — Ambulatory Visit (INDEPENDENT_AMBULATORY_CARE_PROVIDER_SITE_OTHER): Payer: 59

## 2019-01-08 VITALS — BP 160/82 | HR 75 | Temp 98.6°F | Resp 17 | Ht 69.0 in | Wt 181.0 lb

## 2019-01-08 DIAGNOSIS — M722 Plantar fascial fibromatosis: Secondary | ICD-10-CM | POA: Diagnosis not present

## 2019-01-08 DIAGNOSIS — M79671 Pain in right foot: Secondary | ICD-10-CM | POA: Diagnosis not present

## 2019-01-08 NOTE — Progress Notes (Signed)
Dylan Rodgers 68 y.o.   Chief Complaint  Patient presents with  . Foot Pain    right     HISTORY OF PRESENT ILLNESS: This is a 68 y.o. male complaining of right foot pain that started about a week ago after hiking walk.  Better today than 3 days ago.  HPI   Prior to Admission medications   Medication Sig Start Date End Date Taking? Authorizing Provider  albuterol (PROVENTIL HFA;VENTOLIN HFA) 108 (90 Base) MCG/ACT inhaler Inhale 1-2 puffs into the lungs every 4 (four) hours as needed for wheezing or shortness of breath. 05/22/18  Yes Dylan FloodGreene, Jeffrey R, MD  beclomethasone (QVAR REDIHALER) 80 MCG/ACT inhaler Inhale 1 puff into the lungs 2 (two) times daily. 01/24/18  Yes Dylan FloodGreene, Jeffrey R, MD  calcium carbonate (OS-CAL) 600 MG TABS tablet Take 600 mg by mouth 2 (two) times daily with a meal.   Yes [provider]  cholecalciferol (VITAMIN D) 1000 UNITS tablet Take 1,000 Units by mouth daily.   Yes [provider]  FLUoxetine (PROZAC) 20 MG capsule TAKE 1 CAPSULE BY MOUTH EVERY DAY 12/26/18  Yes Dylan FloodGreene, Jeffrey R, MD  Glucosamine-Chondroitin (OSTEO BI-FLEX REGULAR STRENGTH PO) Take by mouth 2 (two) times daily.   Yes [provider]  Multiple Vitamin (MULTIVITAMIN WITH MINERALS) TABS tablet Take 1 tablet by mouth daily.   Yes [provider]  vitamin B-12 (CYANOCOBALAMIN) 1000 MCG tablet Take 1,000 mcg by mouth daily.   Yes [provider]  vitamin C (ASCORBIC ACID) 500 MG tablet Take 500 mg by mouth daily.   Yes [provider]  zinc sulfate 220 MG capsule Take 220 mg by mouth daily.   Yes [provider]    No Known Allergies  Patient Active Problem List   Diagnosis Date Noted  . Moderate intermittent asthma without complication 06/30/2016  . Social anxiety disorder 06/30/2016    Past Medical History:  Diagnosis Date  . Allergy   . Anxiety   . Asthma   . Depression     Past Surgical History:  Procedure  Laterality Date  . APPENDECTOMY      Social History   Socioeconomic History  . Marital status: Married    Spouse name: Not on file  . Number of children: 0  . Years of education: Not on file  . Highest education level: Not on file  Occupational History  . Not on file  Social Needs  . Financial resource strain: Not on file  . Food insecurity:    Worry: Not on file    Inability: Not on file  . Transportation needs:    Medical: Not on file    Non-medical: Not on file  Tobacco Use  . Smoking status: Former Smoker    Packs/day: 0.50    Types: Cigarettes    Last attempt to quit: 11/16/2013    Years since quitting: 5.1  . Smokeless tobacco: Never Used  Substance and Sexual Activity  . Alcohol use: Yes    Comment: once a year  . Drug use: Yes    Types: Marijuana  . Sexual activity: Yes  Lifestyle  . Physical activity:    Days per week: Not on file    Minutes per session: Not on file  . Stress: Not on file  Relationships  . Social connections:    Talks on phone: Not on file    Gets together: Not on file    Attends religious service: Not on file  Active member of club or organization: Not on file    Attends meetings of clubs or organizations: Not on file    Relationship status: Not on file  . Intimate partner violence:    Fear of current or ex partner: Not on file    Emotionally abused: Not on file    Physically abused: Not on file    Forced sexual activity: Not on file  Other Topics Concern  . Not on file  Social History Narrative  . Not on file    No family history on file.   ROS  Vitals:   01/08/19 1410  BP: (!) 160/82  Pulse: 75  Resp: 17  Temp: 98.6 F (37 C)  SpO2: 98%    Physical Exam Vitals signs reviewed.  Constitutional:      Appearance: Normal appearance.  HENT:     Head: Normocephalic.  Neck:     Musculoskeletal: Normal range of motion.  Cardiovascular:     Rate and Rhythm: Normal rate and regular rhythm.     Pulses: Normal  pulses.  Pulmonary:     Breath sounds: Normal breath sounds.  Musculoskeletal:     Comments: Right foot: No erythema or swelling.  Positive tenderness to mid plantar fascia arch level.  NVI.  Skin:    General: Skin is warm and dry.  Neurological:     General: No focal deficit present.     Mental Status: He is alert and oriented to person, place, and time.  Psychiatric:        Mood and Affect: Mood normal.        Behavior: Behavior normal.    Dg Foot Complete Right  Result Date: 01/08/2019 CLINICAL DATA:  Right foot pain. EXAM: RIGHT FOOT COMPLETE - 3+ VIEW COMPARISON:  None. FINDINGS: No acute fracture or dislocation. Joint spaces are preserved. Large plantar and Achilles enthesophytes. Bone mineralization is normal. Soft tissues are unremarkable. IMPRESSION: 1. No acute osseous abnormality or significant degenerative changes. 2. Calcaneal enthesopathy. Electronically Signed   By: Dylan Rodgers M.D.   On: 01/08/2019 15:10     ASSESSMENT & PLAN: Dylan BoomDaniel was seen today for foot pain.  Diagnoses and all orders for this visit:  Foot pain, right -     DG Foot Complete Right; Future  Plantar fasciitis of right foot    Patient Instructions       If you have lab work done today you will be contacted with your lab results within the next 2 weeks.  If you have not heard from us then please contact us. The fastest way to get your results is to register for My Chart.   IF you received an x-ray today, you will receive an invoice from [facility]. Please contact [facility] at [phone] with questions or concerns regarding your invoice.   IF you received labwork today, you will receive an invoice from [facility]. Please contact [facility] at [phone] with questions or concerns regarding your invoice.   Our billing staff will not be able to assist you with questions regarding bills from these companies.  You will be contacted with the lab results as soon as they  are available. The fastest way to get your results is to activate your My Chart account. Instructions are located on the last page of this paperwork. If you have not heard from us regarding the results in 2 weeks, please contact this office.      Plantar Fasciitis  Plantar fasciitis is a painful  foot condition that affects the heel. It occurs when the band of tissue that connects the toes to the heel bone (plantar fascia) becomes irritated. This can happen as the result of exercising too much or doing other repetitive activities (overuse injury). The pain from plantar fasciitis can range from mild irritation to severe pain that makes it difficult to walk or move. The pain is usually worse in the morning after sleeping, or after sitting or lying down for a while. Pain may also be worse after long periods of walking or standing. What are the causes? This condition may be caused by:  Standing for long periods of time.  Wearing shoes that do not have good arch support.  Doing activities that put stress on joints (high-impact activities), including running, aerobics, and ballet.  Being overweight.  An abnormal way of walking (gait).  Tight muscles in the back of your lower leg (calf).  High arches in your feet.  Starting a new athletic activity. What are the signs or symptoms? The main symptom of this condition is heel pain. Pain may:  Be worse with first steps after a time of rest, especially in the morning after sleeping or after you have been sitting or lying down for a while.  Be worse after long periods of standing still.  Decrease after 30-45 minutes of activity, such as gentle walking. How is this diagnosed? This condition may be diagnosed based on your medical history and your symptoms. Your health care provider may ask questions about your activity level. Your health care provider will do a physical exam to check for:  A tender area on the bottom of your foot.  A high arch  in your foot.  Pain when you move your foot.  Difficulty moving your foot. You may have imaging tests to confirm the diagnosis, such as:  X-rays.  Ultrasound.  MRI. How is this treated? Treatment for plantar fasciitis depends on how severe your condition is. Treatment may include:  Rest, ice, applying pressure (compression), and raising the affected foot (elevation). This may be called RICE therapy. Your health care provider may recommend RICE therapy along with over-the-counter pain medicines to manage your pain.  Exercises to stretch your calves and your plantar fascia.  A splint that holds your foot in a stretched, upward position while you sleep (night splint).  Physical therapy to relieve symptoms and prevent problems in the future.  Injections of steroid medicine (cortisone) to relieve pain and inflammation.  Stimulating your plantar fascia with electrical impulses (extracorporeal shock wave therapy). This is usually the last treatment option before surgery.  Surgery, if other treatments have not worked after 12 months. Follow these instructions at home:  Managing pain, stiffness, and swelling  If directed, put ice on the painful area: ? Put ice in a plastic bag, or use a frozen bottle of water. ? Place a towel between your skin and the bag or bottle. ? Roll the bottom of your foot over the bag or bottle. ? Do this for 20 minutes, 2-3 times a day.  Wear athletic shoes that have air-sole or gel-sole cushions, or try wearing soft shoe inserts that are designed for plantar fasciitis.  Raise (elevate) your foot above the level of your heart while you are sitting or lying down. Activity  Avoid activities that cause pain. Ask your health care provider what activities are safe for you.  Do physical therapy exercises and stretches as told by your health care provider.  Try activities  and forms of exercise that are easier on your joints (low-impact). Examples include  swimming, water aerobics, and biking. General instructions  Take over-the-counter and prescription medicines only as told by your health care provider.  Wear a night splint while sleeping, if told by your health care provider. Loosen the splint if your toes tingle, become numb, or turn cold and blue.  Maintain a healthy weight, or work with your health care provider to lose weight as needed.  Keep all follow-up visits as told by your health care provider. This is important. Contact a health care provider if you:  Have symptoms that do not go away after caring for yourself at home.  Have pain that gets worse.  Have pain that affects your ability to move or do your daily activities. Summary  Plantar fasciitis is a painful foot condition that affects the heel. It occurs when the band of tissue that connects the toes to the heel bone (plantar fascia) becomes irritated.  The main symptom of this condition is heel pain that may be worse after exercising too much or standing still for a long time.  Treatment varies, but it usually starts with rest, ice, compression, and elevation (RICE therapy) and over-the-counter medicines to manage pain. This information is not intended to replace advice given to you by your health care provider. Make sure you discuss any questions you have with your health care provider. Document Released: 08/22/2001 Document Revised: 09/24/2017 Document Reviewed: 09/24/2017 Elsevier Interactive Patient Education  2019 Elsevier Inc.      Edwina Barth, MD [facility]

## 2019-01-08 NOTE — Patient Instructions (Addendum)
   If you have lab work done today you will be contacted with your lab results within the next 2 weeks.  If you have not heard from us then please contact us. The fastest way to get your results is to register for My Chart.   IF you received an x-ray today, you will receive an invoice from [facility]. Please contact [facility] at [phone] with questions or concerns regarding your invoice.   IF you received labwork today, you will receive an invoice from [facility]. Please contact [facility] at [phone] with questions or concerns regarding your invoice.   Our billing staff will not be able to assist you with questions regarding bills from these companies.  You will be contacted with the lab results as soon as they are available. The fastest way to get your results is to activate your My Chart account. Instructions are located on the last page of this paperwork. If you have not heard from us regarding the results in 2 weeks, please contact this office.     Plantar Fasciitis  Plantar fasciitis is a painful foot condition that affects the heel. It occurs when the band of tissue that connects the toes to the heel bone (plantar fascia) becomes irritated. This can happen as the result of exercising too much or doing other repetitive activities (overuse injury). The pain from plantar fasciitis can range from mild irritation to severe pain that makes it difficult to walk or move. The pain is usually worse in the morning after sleeping, or after sitting or lying down for a while. Pain may also be worse after long periods of walking or standing. What are the causes? This condition may be caused by:  Standing for long periods of time.  Wearing shoes that do not have good arch support.  Doing activities that put stress on joints (high-impact activities), including running, aerobics, and ballet.  Being overweight.  An abnormal way of walking (gait).  Tight muscles in the  back of your lower leg (calf).  High arches in your feet.  Starting a new athletic activity. What are the signs or symptoms? The main symptom of this condition is heel pain. Pain may:  Be worse with first steps after a time of rest, especially in the morning after sleeping or after you have been sitting or lying down for a while.  Be worse after long periods of standing still.  Decrease after 30-45 minutes of activity, such as gentle walking. How is this diagnosed? This condition may be diagnosed based on your medical history and your symptoms. Your health care provider may ask questions about your activity level. Your health care provider will do a physical exam to check for:  A tender area on the bottom of your foot.  A high arch in your foot.  Pain when you move your foot.  Difficulty moving your foot. You may have imaging tests to confirm the diagnosis, such as:  X-rays.  Ultrasound.  MRI. How is this treated? Treatment for plantar fasciitis depends on how severe your condition is. Treatment may include:  Rest, ice, applying pressure (compression), and raising the affected foot (elevation). This may be called RICE therapy. Your health care provider may recommend RICE therapy along with over-the-counter pain medicines to manage your pain.  Exercises to stretch your calves and your plantar fascia.  A splint that holds your foot in a stretched, upward position while you sleep (night splint).  Physical therapy to relieve symptoms and prevent   problems in the future.  Injections of steroid medicine (cortisone) to relieve pain and inflammation.  Stimulating your plantar fascia with electrical impulses (extracorporeal shock wave therapy). This is usually the last treatment option before surgery.  Surgery, if other treatments have not worked after 12 months. Follow these instructions at home:  Managing pain, stiffness, and swelling  If directed, put ice on the painful  area: ? Put ice in a plastic bag, or use a frozen bottle of water. ? Place a towel between your skin and the bag or bottle. ? Roll the bottom of your foot over the bag or bottle. ? Do this for 20 minutes, 2-3 times a day.  Wear athletic shoes that have air-sole or gel-sole cushions, or try wearing soft shoe inserts that are designed for plantar fasciitis.  Raise (elevate) your foot above the level of your heart while you are sitting or lying down. Activity  Avoid activities that cause pain. Ask your health care provider what activities are safe for you.  Do physical therapy exercises and stretches as told by your health care provider.  Try activities and forms of exercise that are easier on your joints (low-impact). Examples include swimming, water aerobics, and biking. General instructions  Take over-the-counter and prescription medicines only as told by your health care provider.  Wear a night splint while sleeping, if told by your health care provider. Loosen the splint if your toes tingle, become numb, or turn cold and blue.  Maintain a healthy weight, or work with your health care provider to lose weight as needed.  Keep all follow-up visits as told by your health care provider. This is important. Contact a health care provider if you:  Have symptoms that do not go away after caring for yourself at home.  Have pain that gets worse.  Have pain that affects your ability to move or do your daily activities. Summary  Plantar fasciitis is a painful foot condition that affects the heel. It occurs when the band of tissue that connects the toes to the heel bone (plantar fascia) becomes irritated.  The main symptom of this condition is heel pain that may be worse after exercising too much or standing still for a long time.  Treatment varies, but it usually starts with rest, ice, compression, and elevation (RICE therapy) and over-the-counter medicines to manage pain. This  information is not intended to replace advice given to you by your health care provider. Make sure you discuss any questions you have with your health care provider. Document Released: 08/22/2001 Document Revised: 09/24/2017 Document Reviewed: 09/24/2017 Elsevier Interactive Patient Education  2019 Elsevier Inc.  

## 2019-01-10 ENCOUNTER — Other Ambulatory Visit: Payer: Self-pay | Admitting: Family Medicine

## 2019-01-10 DIAGNOSIS — J4520 Mild intermittent asthma, uncomplicated: Secondary | ICD-10-CM

## 2019-01-31 ENCOUNTER — Ambulatory Visit: Payer: 59 | Admitting: Family Medicine

## 2019-03-15 ENCOUNTER — Other Ambulatory Visit: Payer: Self-pay | Admitting: Family Medicine

## 2019-03-15 DIAGNOSIS — J4520 Mild intermittent asthma, uncomplicated: Secondary | ICD-10-CM

## 2019-04-24 ENCOUNTER — Other Ambulatory Visit: Payer: Self-pay | Admitting: Family Medicine

## 2019-04-24 DIAGNOSIS — F4010 Social phobia, unspecified: Secondary | ICD-10-CM

## 2019-07-20 ENCOUNTER — Other Ambulatory Visit: Payer: Self-pay

## 2019-07-20 ENCOUNTER — Other Ambulatory Visit: Payer: Self-pay | Admitting: Family Medicine

## 2019-07-20 DIAGNOSIS — F4010 Social phobia, unspecified: Secondary | ICD-10-CM

## 2019-07-20 MED ORDER — FLUOXETINE HCL 20 MG PO CAPS: ORAL_CAPSULE | ORAL | 0 refills | Status: DC

## 2019-07-20 NOTE — Telephone Encounter (Signed)
Pt given 30 day courtesy refill. Pt  needs to make appt. Requested Prescriptions  Pending Prescriptions Disp Refills  . FLUoxetine (PROZAC) 20 MG capsule [Pharmacy Med Name: FLUOXETINE HCL 20 MG CAPSULE] 30 capsule 0    Sig: TAKE 1 CAPSULE BY MOUTH EVERY DAY     Psychiatry:  Antidepressants - SSRI Failed - 07/20/2019  9:26 AM      Failed - Valid encounter within last 6 months    Recent Outpatient Visits          6 months ago Foot pain, right   [facility] at [facility], Ines Bloomer, MD   6 months ago Patient left without being seen   [facility] at [facility], Lilia Argue, MD   1 year ago Bilateral impacted cerumen   [facility], Ranell Patrick, MD   1 year ago Annual physical exam   [facility], Ranell Patrick, MD   1 year ago Acute upper respiratory infection   [facility] at [facility], Ranell Patrick, MD             Failed - Completed PHQ-2 or PHQ-9 in the last 360 days.

## 2019-07-28 ENCOUNTER — Ambulatory Visit (INDEPENDENT_AMBULATORY_CARE_PROVIDER_SITE_OTHER): Payer: 59 | Admitting: Family Medicine

## 2019-07-28 ENCOUNTER — Other Ambulatory Visit: Payer: Self-pay

## 2019-07-28 ENCOUNTER — Encounter: Payer: Self-pay | Admitting: Family Medicine

## 2019-07-28 VITALS — BP 136/73 | HR 67 | Temp 98.8°F | Resp 16 | Wt 167.8 lb

## 2019-07-28 DIAGNOSIS — Z125 Encounter for screening for malignant neoplasm of prostate: Secondary | ICD-10-CM

## 2019-07-28 DIAGNOSIS — F4010 Social phobia, unspecified: Secondary | ICD-10-CM | POA: Diagnosis not present

## 2019-07-28 DIAGNOSIS — Z1211 Encounter for screening for malignant neoplasm of colon: Secondary | ICD-10-CM

## 2019-07-28 DIAGNOSIS — E785 Hyperlipidemia, unspecified: Secondary | ICD-10-CM | POA: Diagnosis not present

## 2019-07-28 DIAGNOSIS — J4520 Mild intermittent asthma, uncomplicated: Secondary | ICD-10-CM | POA: Diagnosis not present

## 2019-07-28 DIAGNOSIS — Z23 Encounter for immunization: Secondary | ICD-10-CM | POA: Diagnosis not present

## 2019-07-28 MED ORDER — QVAR REDIHALER 80 MCG/ACT IN AERB: INHALATION_SPRAY | RESPIRATORY_TRACT | 5 refills | Status: DC

## 2019-07-28 MED ORDER — FLUOXETINE HCL 20 MG PO CAPS: ORAL_CAPSULE | ORAL | 3 refills | Status: DC

## 2019-07-28 NOTE — Patient Instructions (Addendum)
   Okay to continue using Qvar daily, or could potentially space it out to every few days if symptoms are continuing to remain controlled with asthma.  Albuterol if needed for flares.  Continue fluoxetine daily for anxiety symptoms but if you feel those are well controlled, can try tapering off that medicine temporarily as well.  I will refer you to a gastroenterologist for colon cancer screening, and pneumonia vaccine updated today.  Please return in the next week or two if possible for fasting blood work and we can recheck cholesterol and prostate test at that time.  Return to the clinic or go to the nearest emergency room if any of your symptoms worsen or new symptoms occur.   If you have lab work done today you will be contacted with your lab results within the next 2 weeks.  If you have not heard from [place] then please contact us. The fastest way to get your results is to register for My Chart.   IF you received an x-ray today, you will receive an invoice from [facility]. Please contact [facility] at [phone] with questions or concerns regarding your invoice.   IF you received labwork today, you will receive an invoice from [facility]. Please contact [facility] at [phone] with questions or concerns regarding your invoice.   Our billing staff will not be able to assist you with questions regarding bills from these companies.  You will be contacted with the lab results as soon as they are available. The fastest way to get your results is to activate your My Chart account. Instructions are located on the last page of this paperwork. If you have not heard from [place] regarding the results in 2 weeks, please contact this office.

## 2019-07-28 NOTE — Progress Notes (Signed)
Subjective:    Patient ID: Dylan Rodgers, male    DOB: [birth year], 68 y.o.   MRN: [id number]  HPI Dylan Rodgers is a 68 y.o. male Presents today for: Chief Complaint  Patient presents with  . Depression    Need a refill on prozac and Qvar    Anxiety/social anxiety: Reported well-controlled in the past with Prozac 20 mg daily.  Other options discussed last March, but decide remain on same dose. Still feels like doing well - not sure if still needs.   Depression screen [facility] 2/9 07/28/2019 01/08/2019 01/07/2019 05/07/2018 03/05/2018  Decreased Interest 0 0 0 0 0  Down, Depressed, Hopeless 0 0 0 0 0  PHQ - 2 Score 0 0 0 0 0  Altered sleeping - 0 - - -  Tired, decreased energy - 0 - - -  Change in appetite - 0 - - -  Feeling bad or failure about yourself  - 0 - - -  Trouble concentrating - 0 - - -  Moving slowly or fidgety/restless - 0 - - -  Suicidal thoughts - 0 - - -  PHQ-9 Score - 0 - - -  Difficult doing work/chores - Not difficult at all - - -   GAD 7 : Generalized Anxiety Score 07/28/2019  Nervous, Anxious, on Edge 0  Control/stop worrying 0  Worry too much - different things 0  Trouble relaxing 0  Restless 0  Easily annoyed or irritable 0  Afraid - awful might happen 0  Total GAD 7 Score 0    Asthma Moderate intermittent previously.  Treated with Qvar with albuterol as needed.  Did discuss last March and felt the Qvar was working better, and he is on same.  Only taking Qvar once per day.  Has been doing well - no albuterol needed in months/.  Still some biking on e-bike, road bike.   Not fasting today. Plans to have checked at physical    Health maintenance: Colonoscopy referral placed last year.  Has not yet had performed. Agrees to new referral.  Pneumovax - s/p prevnar, pneumovax today.  No dysuria/hesitancy, no hematuria. Last PSA 2.6 up from 1.6 year prior.  Lab Results  Component Value Date   CHOL 262 (H) 03/05/2018   HDL 79 03/05/2018   LDLCALC 147  (H) 03/05/2018   TRIG 178 (H) 03/05/2018   CHOLHDL 3.3 03/05/2018      Immunization History  Administered Date(s) Administered  . Influenza, High Dose Seasonal PF 08/28/2018  . Influenza,inj,Quad PF,6+ Mos 08/18/2017  . Pneumococcal Conjugate-13 03/05/2018  . Tdap 02/22/2017     Patient Active Problem List   Diagnosis Date Noted  . Moderate intermittent asthma without complication 09/73/5329  . Social anxiety disorder 06/30/2016   Past Medical History:  Diagnosis Date  . Allergy   . Anxiety   . Asthma   . Depression    Past Surgical History:  Procedure Laterality Date  . APPENDECTOMY     No Known Allergies Prior to Admission medications   Medication Sig Start Date End Date Taking? Authorizing Provider  albuterol (PROVENTIL HFA;VENTOLIN HFA) 108 (90 Base) MCG/ACT inhaler Inhale 1-2 puffs into the lungs every 4 (four) hours as needed for wheezing or shortness of breath. 05/22/18  Yes [name], MD  calcium carbonate (OS-CAL) 600 MG TABS tablet Take 600 mg by mouth 2 (two) times daily with a meal.   Yes [provider]  cholecalciferol (VITAMIN D) 1000 UNITS tablet  Take 1,000 Units by mouth daily.   Yes [provider]  FLUoxetine (PROZAC) 20 MG capsule TAKE 1 CAPSULE BY MOUTH EVERY DAY 07/20/19  Yes Shade FloodGreene, Carissa Musick R, MD  Glucosamine-Chondroitin (OSTEO BI-FLEX REGULAR STRENGTH PO) Take by mouth 2 (two) times daily.   Yes [provider]  Multiple Vitamin (MULTIVITAMIN WITH MINERALS) TABS tablet Take 1 tablet by mouth daily.   Yes [provider]  QVAR REDIHALER 80 MCG/ACT inhaler INHALE 1 PUFF BY MOUTH TWICE A DAY 03/15/19  Yes Shade FloodGreene, Terrika Zuver R, MD  vitamin B-12 (CYANOCOBALAMIN) 1000 MCG tablet Take 1,000 mcg by mouth daily.   Yes [provider]  vitamin C (ASCORBIC ACID) 500 MG tablet Take 500 mg by mouth daily.   Yes [provider]  zinc sulfate 220 MG capsule Take 220 mg by mouth daily.   Yes [provider]   Social History   Socioeconomic History  . Marital status: Married    Spouse name: Not on file  . Number of children: 0  . Years of education: Not on file  . Highest education level: Not on file  Occupational History  . Not on file  Social Needs  . Financial resource strain: Not on file  . Food insecurity    Worry: Not on file    Inability: Not on file  . Transportation needs    Medical: Not on file    Non-medical: Not on file  Tobacco Use  . Smoking status: Former Smoker    Packs/day: 0.50    Types: Cigarettes    Quit date: 11/16/2013    Years since quitting: 5.6  . Smokeless tobacco: Never Used  Substance and Sexual Activity  . Alcohol use: Yes    Comment: once a year  . Drug use: Yes    Types: Marijuana  . Sexual activity: Yes  Lifestyle  . Physical activity    Days per week: Not on file    Minutes per session: Not on file  . Stress: Not on file  Relationships  . Social Musicianconnections    Talks on phone: Not on file    Gets together: Not on file    Attends religious service: Not on file    Active member of club or organization: Not on file    Attends meetings of clubs or organizations: Not on file    Relationship status: Not on file  . Intimate partner violence    Fear of current or ex partner: Not on file    Emotionally abused: Not on file    Physically abused: Not on file    Forced sexual activity: Not on file  Other Topics Concern  . Not on file  Social History Narrative  . Not on file    Review of Systems  Respiratory: Negative for shortness of breath and wheezing.   Psychiatric/Behavioral: The patient is not nervous/anxious.    Other per HPI.    Objective:   Physical Exam Vitals signs reviewed.  Constitutional:      Appearance: He is well-developed.  HENT:     Head: Normocephalic and atraumatic.  Eyes:     Pupils: Pupils are equal, round, and reactive to light.  Neck:     Vascular: No carotid bruit or JVD.  Cardiovascular:      Rate and Rhythm: Normal rate and regular rhythm.     Heart sounds: Normal heart sounds. No murmur.  Pulmonary:     Effort: Pulmonary effort is normal.  Breath sounds: Normal breath sounds. No rales.  Skin:    General: Skin is warm and dry.  Neurological:     Mental Status: He is alert and oriented to person, place, and time.  Psychiatric:        Mood and Affect: Mood normal.        Behavior: Behavior normal.        Thought Content: Thought content normal.    Vitals:   07/28/19 1511  BP: 136/73  Pulse: 67  Resp: 16  Temp: 98.8 F (37.1 C)  TempSrc: Oral  SpO2: 97%  Weight: 167 lb 12.8 oz (76.1 kg)      Assessment & Plan:   Eulogio DitchDaniel K Zulueta is a 68 y.o. male Mild intermittent asthma without complication - Plan: beclomethasone (QVAR REDIHALER) 80 MCG/ACT inhaler  -Well-controlled.  Option of intermittent inhaled corticosteroid use if remains well controlled.  Albuterol if needed for flares.  RTC precautions  Social anxiety disorder - Plan: FLUoxetine (PROZAC) 20 MG capsule  -Stable with fluoxetine.  He is considering trial off medication, RTC precautions if worsening symptoms.  Hyperlipidemia, unspecified hyperlipidemia type - Plan: Lipid panel, Comprehensive metabolic panel  -Lab only visit ordered, follow-up in the next 3 to 6 months for wellness exam.  Screening for prostate cancer - Plan: PSA  -Normal PSA prior, but was slightly increased from previous reading.  Lab only visit for repeat PSA  Special screening for malignant neoplasms, colon - Plan: Ambulatory referral to Gastroenterology  Need for prophylactic vaccination against Streptococcus pneumoniae (pneumococcus) - Plan: Pneumococcal polysaccharide vaccine 23-valent greater than or equal to 2yo subcutaneous/IM   Meds ordered this encounter  Medications  . FLUoxetine (PROZAC) 20 MG capsule    Sig: TAKE 1 CAPSULE BY MOUTH EVERY DAY    Dispense:  90 capsule    Refill:  3  . beclomethasone (QVAR  REDIHALER) 80 MCG/ACT inhaler    Sig: INHALE 1 PUFF BY MOUTH TWICE A DAY as needed.    Dispense:  10.6 g    Refill:  5    DX Code Needed  .   Patient Instructions     Okay to continue using Qvar daily, or could potentially space it out to every few days if symptoms are continuing to remain controlled with asthma.  Albuterol if needed for flares.  Continue fluoxetine daily for anxiety symptoms but if you feel those are well controlled, can try tapering off that medicine temporarily as well.  I will refer you to a gastroenterologist for colon cancer screening, and pneumonia vaccine updated today.  Please return in the next week or two if possible for fasting blood work and we can recheck cholesterol and prostate test at that time.  Return to the clinic or go to the nearest emergency room if any of your symptoms worsen or new symptoms occur.   If you have lab work done today you will be contacted with your lab results within the next 2 weeks.  If you have not heard from us then please contact us. The fastest way to get your results is to register for My Chart.   IF you received an x-ray today, you will receive an invoice from [facility]. Please contact [facility] at [phone] with questions or concerns regarding your invoice.   IF you received labwork today, you will receive an invoice from [facility]. Please contact [facility] at [phone] with questions or concerns regarding your invoice.   Our billing staff will not be able  to assist you with questions regarding bills from these companies.  You will be contacted with the lab results as soon as they are available. The fastest way to get your results is to activate your My Chart account. Instructions are located on the last page of this paperwork. If you have not heard from us regarding the results in 2 weeks, please contact this office.       Signed,   Meredith StaggersJeffrey Siarah Deleo, MD Primary [facility] at [facility].  07/29/19 12:42 PM

## 2019-07-29 ENCOUNTER — Encounter: Payer: Self-pay | Admitting: Family Medicine

## 2019-08-29 ENCOUNTER — Encounter: Payer: Self-pay | Admitting: Family Medicine

## 2019-12-25 ENCOUNTER — Other Ambulatory Visit: Payer: Self-pay | Admitting: Family Medicine

## 2019-12-25 DIAGNOSIS — F4010 Social phobia, unspecified: Secondary | ICD-10-CM

## 2020-09-08 ENCOUNTER — Other Ambulatory Visit: Payer: Self-pay | Admitting: Family Medicine

## 2020-09-08 DIAGNOSIS — J4520 Mild intermittent asthma, uncomplicated: Secondary | ICD-10-CM

## 2020-09-08 NOTE — Telephone Encounter (Signed)
Requested medication (s) are due for refill today: Yes  Requested medication (s) are on the active medication list: Yes  Last refill:  07/28/19  Future visit scheduled: No  Notes to clinic:  Prescription has expired.    Requested Prescriptions  Pending Prescriptions Disp Refills   QVAR REDIHALER 80 MCG/ACT inhaler [Pharmacy Med Name: QVAR REDIHALER 80 MCG] 10.6 g 5    Sig: INHALE 1 PUFF BY MOUTH TWICE A DAY as needed.      Pulmonology:  Corticosteroids Failed - 09/08/2020  3:14 PM      Failed - Valid encounter within last 12 months    Recent Outpatient Visits           1 year ago Mild intermittent asthma without complication   [facility], Asencion Partridge, MD   1 year ago Foot pain, right   [facility] at [facility], Eilleen Kempf, MD   1 year ago Patient left without being seen   [facility], Meda Coffee, MD   2 years ago Bilateral impacted cerumen   [facility], Asencion Partridge, MD   2 years ago Annual physical exam   [facility], Asencion Partridge, MD

## 2020-09-15 ENCOUNTER — Encounter: Payer: Self-pay | Admitting: Registered Nurse

## 2020-09-15 ENCOUNTER — Other Ambulatory Visit: Payer: Self-pay

## 2020-09-15 ENCOUNTER — Ambulatory Visit (INDEPENDENT_AMBULATORY_CARE_PROVIDER_SITE_OTHER): Payer: No Typology Code available for payment source | Admitting: Registered Nurse

## 2020-09-15 VITALS — BP 158/74 | HR 89 | Temp 97.6°F | Resp 18 | Ht 69.0 in | Wt 156.0 lb

## 2020-09-15 DIAGNOSIS — Z1329 Encounter for screening for other suspected endocrine disorder: Secondary | ICD-10-CM

## 2020-09-15 DIAGNOSIS — Z13228 Encounter for screening for other metabolic disorders: Secondary | ICD-10-CM

## 2020-09-15 DIAGNOSIS — Z125 Encounter for screening for malignant neoplasm of prostate: Secondary | ICD-10-CM

## 2020-09-15 DIAGNOSIS — E785 Hyperlipidemia, unspecified: Secondary | ICD-10-CM

## 2020-09-15 DIAGNOSIS — Z130 Encounter for screening for diseases of the blood and blood-forming organs and certain disorders involving the immune mechanism: Secondary | ICD-10-CM

## 2020-09-15 DIAGNOSIS — J4520 Mild intermittent asthma, uncomplicated: Secondary | ICD-10-CM

## 2020-09-15 MED ORDER — QVAR REDIHALER 80 MCG/ACT IN AERB: INHALATION_SPRAY | RESPIRATORY_TRACT | 5 refills | Status: DC

## 2020-09-15 NOTE — Progress Notes (Signed)
Established Patient Office Visit  Subjective:  Patient ID: Dylan Rodgers, male    DOB: Dec 20, 1950  Age: 69 y.o. MRN: [id number]  CC:  Chief Complaint  Patient presents with   Medication Refill    Patient states he is here for medication refill on Pended medication. Per patient he has no questions or concerns     HPI Dylan Rodgers presents for QVar refill  No complaints or concerns. Works as intended. 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 Denies CV symptoms.  No further complaints Will repeat lab work today.  Past Medical History:  Diagnosis Date   Allergy    Anxiety    Asthma    Depression     Past Surgical History:  Procedure Laterality Date   APPENDECTOMY      No family history on file.  Social History   Socioeconomic History   Marital status: Married    Spouse name: Not on file   Number of children: 0   Years of education: Not on file   Highest education level: Not on file  Occupational History   Not on file  Tobacco Use   Smoking status: Former Smoker    Packs/day: 0.50    Types: Cigarettes    Quit date: 11/16/2013    Years since quitting: 6.8   Smokeless tobacco: Never Used  Vaping Use   Vaping Use: Never used  Substance and Sexual Activity   Alcohol use: Yes    Comment: once a year   Drug use: Yes    Types: Marijuana   Sexual activity: Yes  Other Topics Concern   Not on file  Social History Narrative   Not on file   Social Determinants of Health   Financial Resource Strain:    Difficulty of Paying Living Expenses: Not on file  Food Insecurity:    Worried About Programme researcher, broadcasting/film/video in the Last Year: Not on file   [employer] of Food in the Last Year: Not on file  Transportation Needs:     Lack of Transportation (Medical): Not on file   Lack of Transportation (Non-Medical): Not on file  Physical Activity:    Days of Exercise per Week: Not on file   Minutes of Exercise per Session: Not on file  Stress:    Feeling of Stress : Not on file  Social Connections:    Frequency of Communication with Friends and Family: Not on file   Frequency of Social Gatherings with Friends and Family: Not on file   Attends Religious Services: Not on file   Active Member of Clubs or Organizations: Not on file   Attends Banker Meetings: Not on file   Marital Status: Not on file  Intimate Partner Violence:    Fear of Current or Ex-Partner: Not on file   Emotionally Abused: Not on file   Physically Abused: Not on file   Sexually Abused: Not on file    Outpatient Medications Prior to Visit  Medication Sig Dispense Refill   albuterol (PROVENTIL HFA;VENTOLIN HFA) 108 (90 Base) MCG/ACT inhaler Inhale 1-2 puffs into the lungs every 4 (four) hours as needed for wheezing or shortness of breath. 1 Inhaler 0   calcium  carbonate (OS-CAL) 600 MG TABS tablet Take 600 mg by mouth 2 (two) times daily with a meal.     cholecalciferol (VITAMIN D) 1000 UNITS tablet Take 1,000 Units by mouth daily.     Glucosamine-Chondroitin (OSTEO BI-FLEX REGULAR STRENGTH PO) Take by mouth 2 (two) times daily.     Multiple Vitamin (MULTIVITAMIN WITH MINERALS) TABS tablet Take 1 tablet by mouth daily.     vitamin B-12 (CYANOCOBALAMIN) 1000 MCG tablet Take 1,000 mcg by mouth daily.     vitamin C (ASCORBIC ACID) 500 MG tablet Take 500 mg by mouth daily.     zinc sulfate 220 MG capsule Take 220 mg by mouth daily.     beclomethasone (QVAR REDIHALER) 80 MCG/ACT inhaler INHALE 1 PUFF BY MOUTH TWICE A DAY as needed. 10.6 g 5   FLUoxetine (PROZAC) 20 MG capsule TAKE 1 CAPSULE BY MOUTH EVERY DAY (Patient not taking: Reported on 09/15/2020) 90 capsule 3   No facility-administered medications prior to  visit.    No Known Allergies  ROS Review of Systems  Constitutional: Negative.   HENT: Negative.   Eyes: Negative.   Respiratory: Negative.   Cardiovascular: Negative.   Gastrointestinal: Negative.   Genitourinary: Negative.   Musculoskeletal: Negative.   Skin: Negative.   Neurological: Negative.   Psychiatric/Behavioral: Negative.       Objective:    Physical Exam Constitutional:      General: He is not in acute distress.    Appearance: Normal appearance. He is normal weight. He is not ill-appearing, toxic-appearing or diaphoretic.  Cardiovascular:     Rate and Rhythm: Normal rate and regular rhythm.     Heart sounds: Normal heart sounds. No murmur heard.  No friction rub. No gallop.   Pulmonary:     Effort: Pulmonary effort is normal. No respiratory distress.     Breath sounds: Normal breath sounds. No stridor. No wheezing, rhonchi or rales.  Chest:     Chest wall: No tenderness.  Neurological:     General: No focal deficit present.     Mental Status: He is alert and oriented to person, place, and time. Mental status is at baseline.  Psychiatric:        Mood and Affect: Mood normal.        Behavior: Behavior normal.        Thought Content: Thought content normal.        Judgment: Judgment normal.     BP (!) 158/74    Pulse 89    Temp 97.6 F (36.4 C) (Temporal)    Resp 18    Ht 5\' 9"  (1.753 m)    Wt 156 lb (70.8 kg)    SpO2 97%    BMI 23.04 kg/m  Wt Readings from Last 3 Encounters:  09/15/20 156 lb (70.8 kg)  07/28/19 167 lb 12.8 oz (76.1 kg)  01/08/19 181 lb (82.1 kg)     There are no preventive care reminders to display for this patient.  There are no preventive care reminders to display for this patient.  No results found for: TSH Lab Results  Component Value Date   WBC 10.7 (H) 11/29/2013   HGB 15.5 11/29/2013   HCT 44.1 11/29/2013   MCV 90.0 11/29/2013   PLT 197 11/29/2013   Lab Results  Component Value Date   NA 140 03/05/2018   K 4.7  03/05/2018   CO2 25 03/05/2018   GLUCOSE 93 03/05/2018   BUN 14 03/05/2018   CREATININE  0.82 03/05/2018   BILITOT 0.2 03/05/2018   ALKPHOS 67 03/05/2018   AST 22 03/05/2018   ALT 30 03/05/2018   PROT 7.4 03/05/2018   ALBUMIN 4.6 03/05/2018   CALCIUM 9.7 03/05/2018   Lab Results  Component Value Date   CHOL 262 (H) 03/05/2018   Lab Results  Component Value Date   HDL 79 03/05/2018   Lab Results  Component Value Date   LDLCALC 147 (H) 03/05/2018   Lab Results  Component Value Date   TRIG 178 (H) 03/05/2018   Lab Results  Component Value Date   CHOLHDL 3.3 03/05/2018   No results found for: HGBA1C    Assessment & Plan:   Problem List Items Addressed This Visit    None    Visit Diagnoses    Screening for prostate cancer    -  Primary   Relevant Orders   PSA   Mild intermittent asthma without complication       Relevant Medications   beclomethasone (QVAR REDIHALER) 80 MCG/ACT inhaler   Hyperlipidemia, unspecified hyperlipidemia type       Relevant Orders   Lipid panel   Screening for endocrine, metabolic and immunity disorder       Relevant Orders   CBC with Differential   Comprehensive metabolic panel      Meds ordered this encounter  Medications   DISCONTD: beclomethasone (QVAR REDIHALER) 80 MCG/ACT inhaler    Sig: INHALE 1 PUFF BY MOUTH TWICE A DAY as needed.    Dispense:  10.6 g    Refill:  5    DX Code Needed  .   beclomethasone (QVAR REDIHALER) 80 MCG/ACT inhaler    Sig: INHALE 1 PUFF BY MOUTH TWICE A DAY as needed.    Dispense:  10.6 g    Refill:  5    DX Code Needed  .    Order Specific Question:   Supervising Provider    Answer:   Neva Seat, JEFFREY R [[id number]]    Follow-up: No follow-ups on file.   PLAN  Repeat lab work - will follow up as warranted  Sent refills of QVar  Follow up prn or annually with PCP Dr. Neva Seat  Patient encouraged to call clinic with any questions, comments, or concerns.  Janeece Agee, NP

## 2020-09-15 NOTE — Patient Instructions (Signed)
° ° ° °  If you have lab work done today you will be contacted with your lab results within the next 2 weeks.  If you have not heard from us then please contact us. The fastest way to get your results is to register for My Chart. ° ° °IF you received an x-ray today, you will receive an invoice from [facility]. Please contact [facility] at [phone] with questions or concerns regarding your invoice.  ° °IF you received labwork today, you will receive an invoice from [facility]. Please contact [facility] at [phone] with questions or concerns regarding your invoice.  ° °Our billing staff will not be able to assist you with questions regarding bills from these companies. ° °You will be contacted with the lab results as soon as they are available. The fastest way to get your results is to activate your My Chart account. Instructions are located on the last page of this paperwork. If you have not heard from us regarding the results in 2 weeks, please contact this office. °  ° ° ° °

## 2020-09-16 LAB — COMPREHENSIVE METABOLIC PANEL
ALT: 24 IU/L (ref 0–44)
AST: 26 IU/L (ref 0–40)
Albumin/Globulin Ratio: 1.9 (ref 1.2–2.2)
Albumin: 4.8 g/dL (ref 3.8–4.8)
Alkaline Phosphatase: 56 IU/L (ref 44–121)
BUN/Creatinine Ratio: 25 — ABNORMAL HIGH (ref 10–24)
BUN: 24 mg/dL (ref 8–27)
Bilirubin Total: 0.3 mg/dL (ref 0.0–1.2)
CO2: 24 mmol/L (ref 20–29)
Calcium: 10.1 mg/dL (ref 8.6–10.2)
Chloride: 103 mmol/L (ref 96–106)
Creatinine, Ser: 0.95 mg/dL (ref 0.76–1.27)
GFR calc Af Amer: 95 mL/min/{1.73_m2} (ref >59)
GFR calc non Af Amer: 82 mL/min/{1.73_m2} (ref >59)
Globulin, Total: 2.5 g/dL (ref 1.5–4.5)
Glucose: 86 mg/dL (ref 65–99)
Potassium: 4.5 mmol/L (ref 3.5–5.2)
Sodium: 142 mmol/L (ref 134–144)
Total Protein: 7.3 g/dL (ref 6.0–8.5)

## 2020-09-16 LAB — CBC WITH DIFFERENTIAL/PLATELET
Basophils Absolute: 0.1 10*3/uL (ref 0.0–0.2)
Basos: 1 %
EOS (ABSOLUTE): 0.2 10*3/uL (ref 0.0–0.4)
Eos: 3 %
Hematocrit: 40 % (ref 37.5–51.0)
Hemoglobin: 13.2 g/dL (ref 13.0–17.7)
Immature Grans (Abs): 0 10*3/uL (ref 0.0–0.1)
Immature Granulocytes: 0 %
Lymphocytes Absolute: 1.8 10*3/uL (ref 0.7–3.1)
Lymphs: 25 %
MCH: 30.8 pg (ref 26.6–33.0)
MCHC: 33 g/dL (ref 31.5–35.7)
MCV: 93 fL (ref 79–97)
Monocytes Absolute: 0.5 10*3/uL (ref 0.1–0.9)
Monocytes: 7 %
Neutrophils Absolute: 4.6 10*3/uL (ref 1.4–7.0)
Neutrophils: 64 %
Platelets: 207 10*3/uL (ref 150–450)
RBC: 4.29 x10E6/uL (ref 4.14–5.80)
RDW: 12.9 % (ref 11.6–15.4)
WBC: 7.2 10*3/uL (ref 3.4–10.8)

## 2020-09-16 LAB — LIPID PANEL
Chol/HDL Ratio: 2.5 ratio (ref 0.0–5.0)
Cholesterol, Total: 224 mg/dL — ABNORMAL HIGH (ref 100–199)
HDL: 89 mg/dL (ref >39)
LDL Chol Calc (NIH): 121 mg/dL — ABNORMAL HIGH (ref 0–99)
Triglycerides: 84 mg/dL (ref 0–149)
VLDL Cholesterol Cal: 14 mg/dL (ref 5–40)

## 2020-09-16 LAB — PSA: Prostate Specific Ag, Serum: 1.9 ng/mL (ref 0.0–4.0)

## 2020-09-19 ENCOUNTER — Encounter: Payer: Self-pay | Admitting: Registered Nurse

## 2020-10-05 IMAGING — DX DG FOOT COMPLETE 3+V*R*
3 series · 3 of 3 positions shown · non-contrast
Comparison: None.

CLINICAL DATA: Right foot pain.

EXAM:
RIGHT FOOT COMPLETE - 3+ VIEW

[foot ap]
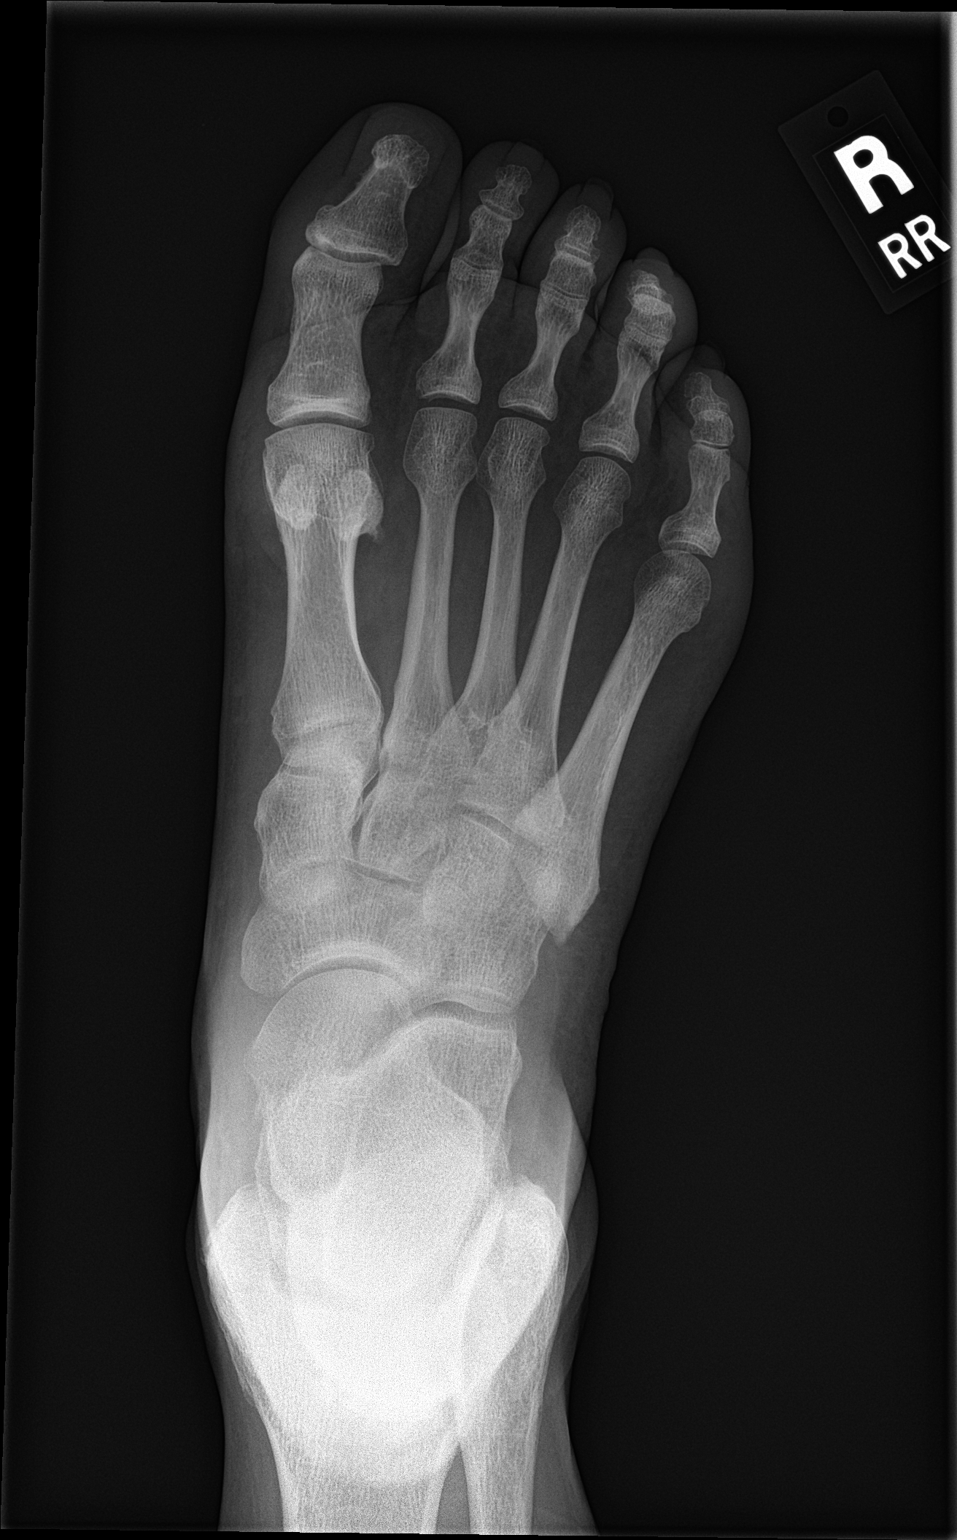

[foot obl]
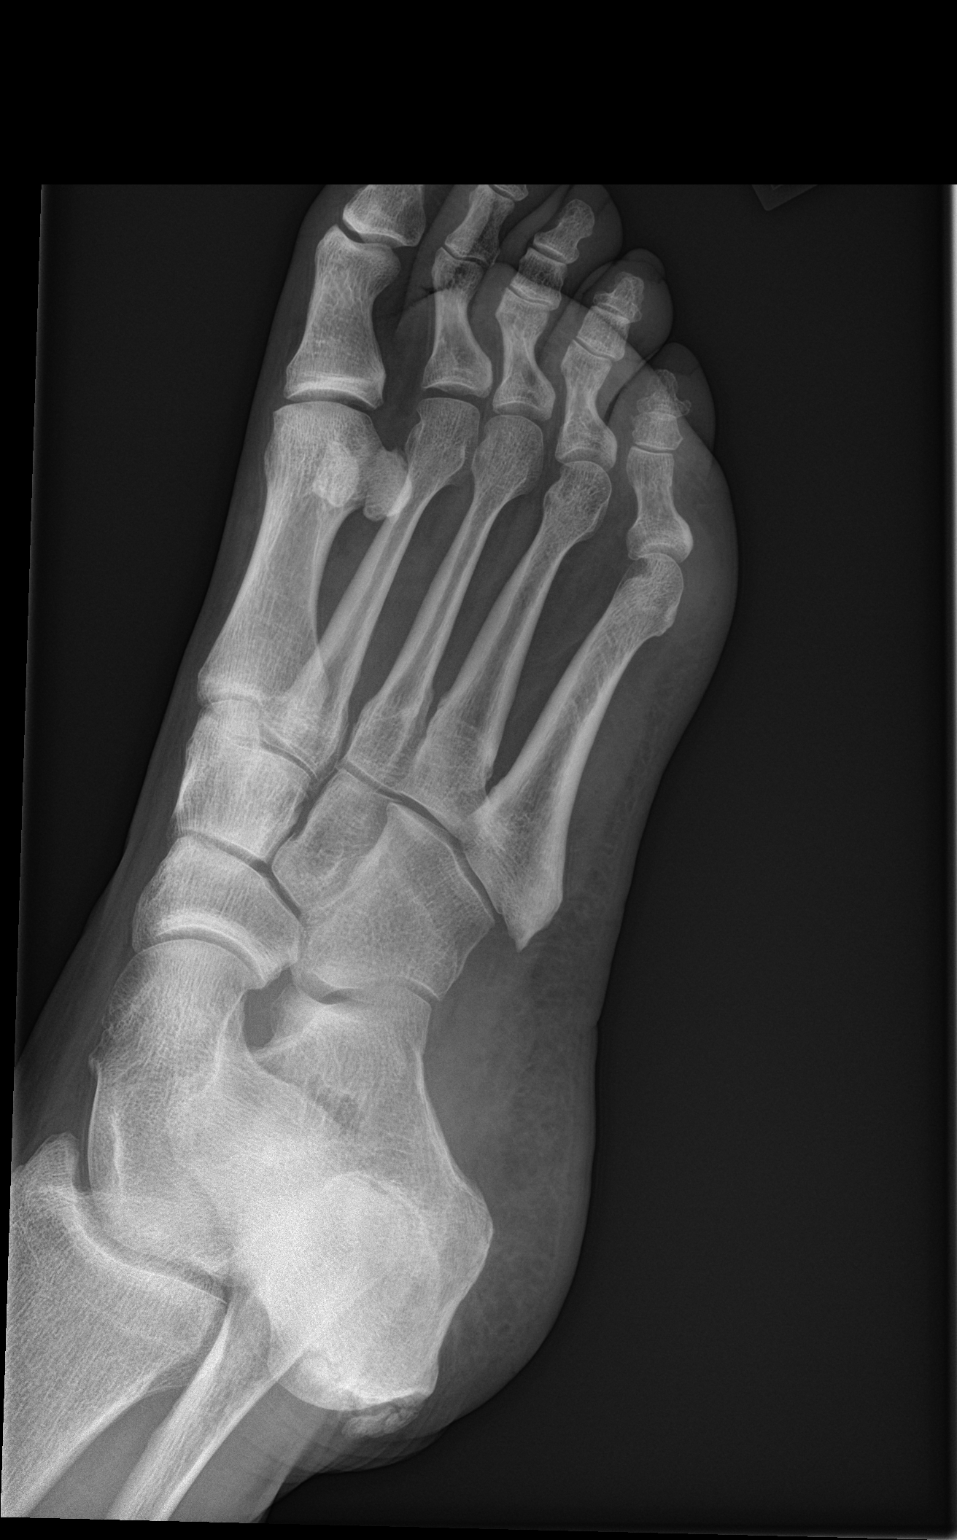

[foot lat]
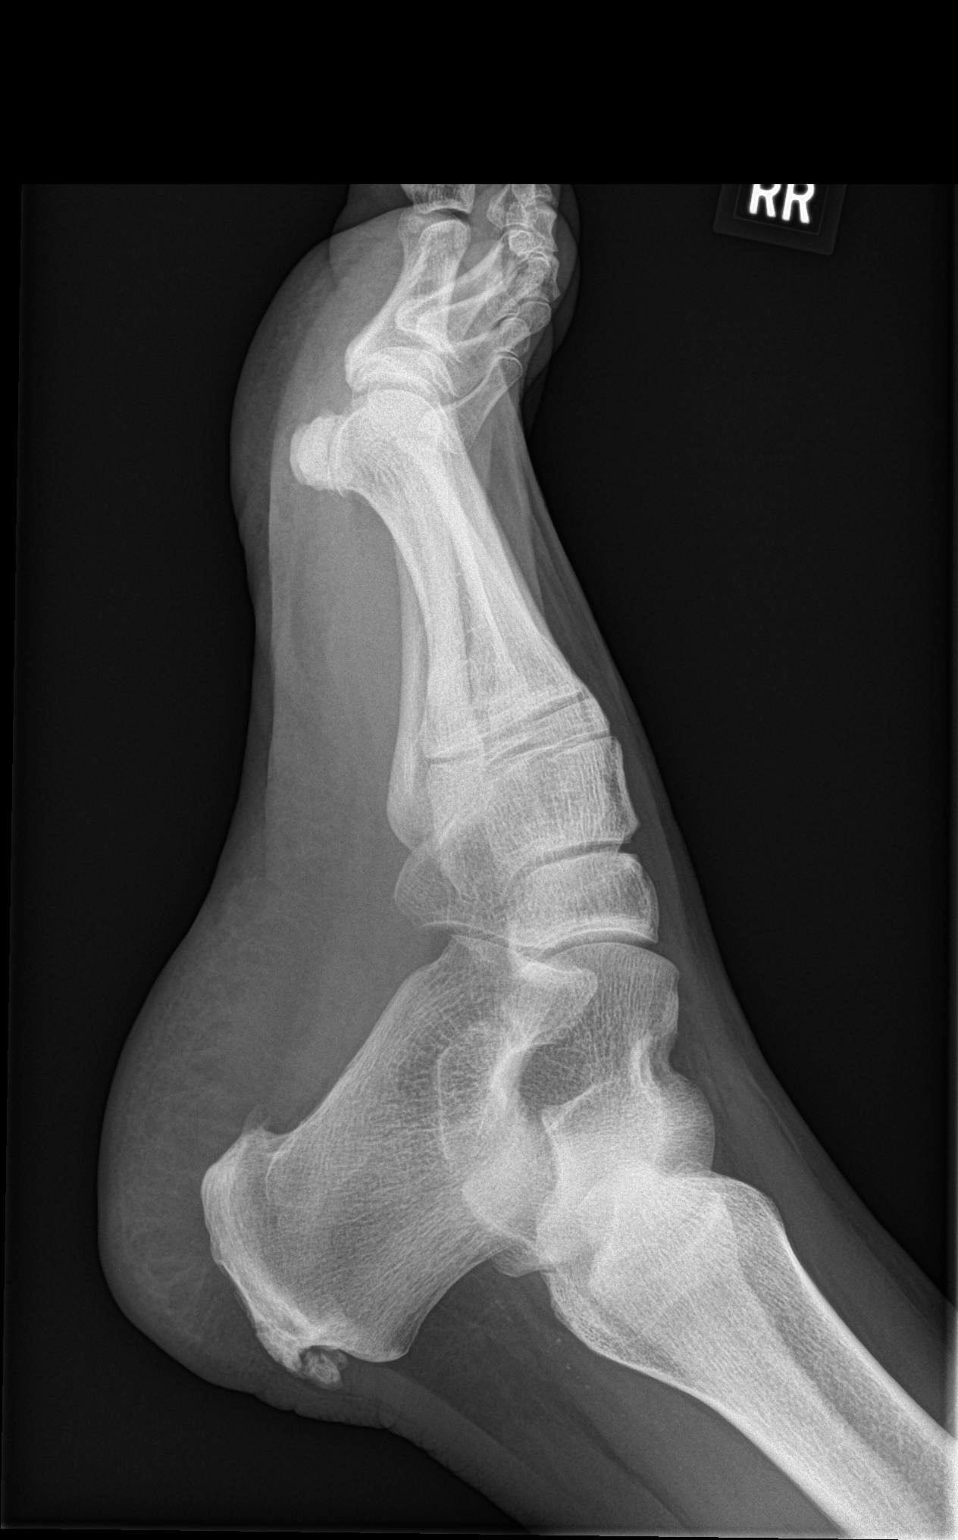

[3 of 3 positions shown; findings below may reference images not displayed]

FINDINGS: No acute fracture or dislocation. Joint spaces are preserved. Large
plantar and Achilles enthesophytes. Bone mineralization is normal.
Soft tissues are unremarkable.
IMPRESSION: 1. No acute osseous abnormality or significant degenerative changes.
2. Calcaneal enthesopathy.

## 2021-09-24 ENCOUNTER — Other Ambulatory Visit: Payer: Self-pay | Admitting: Registered Nurse

## 2021-09-24 DIAGNOSIS — J4520 Mild intermittent asthma, uncomplicated: Secondary | ICD-10-CM

## 2022-02-15 ENCOUNTER — Other Ambulatory Visit: Payer: Self-pay | Admitting: Registered Nurse

## 2022-02-15 DIAGNOSIS — J4520 Mild intermittent asthma, uncomplicated: Secondary | ICD-10-CM

## 2022-02-16 ENCOUNTER — Other Ambulatory Visit: Payer: Self-pay | Admitting: Registered Nurse

## 2022-02-16 DIAGNOSIS — J4520 Mild intermittent asthma, uncomplicated: Secondary | ICD-10-CM

## 2022-02-16 MED ORDER — PULMICORT FLEXHALER 180 MCG/ACT IN AEPB: 1.0000 | INHALATION_SPRAY | Freq: Two times a day (BID) | RESPIRATORY_TRACT | 3 refills | Status: AC

## 2022-02-16 NOTE — Telephone Encounter (Signed)
I keep getting this sent. I tried to get Pulmicort sent in it's place. I don't know why I keep getting this message ? ?Thanks, ? ?Rich

## 2022-02-16 NOTE — Progress Notes (Signed)
Flovent not on formulary. Will send pulmicort ? ?Jari Sportsman, NP ?

## 2022-02-17 NOTE — Telephone Encounter (Signed)
Please see prior request.  ?

## 2022-02-17 NOTE — Telephone Encounter (Signed)
Please check to see if Qvar or pulmicort went through, and if not what alternatives will they cover. Thanks.  ?

## 2023-07-23 ENCOUNTER — Telehealth: Payer: Self-pay | Admitting: Family Medicine

## 2023-07-23 NOTE — Telephone Encounter (Signed)
Left patient a voicemail to schedule annual physical- last seen 2021

## 2023-10-12 ENCOUNTER — Other Ambulatory Visit: Payer: Self-pay | Admitting: Family Medicine

## 2023-10-12 DIAGNOSIS — J4520 Mild intermittent asthma, uncomplicated: Secondary | ICD-10-CM

## 2023-10-12 NOTE — Telephone Encounter (Signed)
Called left pt a voicemail to make appt. Have not seen since 2021
